# Patient Record
Sex: Female | Born: 1960 | Race: Black or African American | Hispanic: No | Marital: Single | Smoking: Never smoker
Health system: Southern US, Community
[De-identification: ages and names within clinical notes are randomized; demographics above are authoritative.]

## PROBLEM LIST (undated history)

## (undated) DIAGNOSIS — I10 Essential (primary) hypertension: Secondary | ICD-10-CM

## (undated) DIAGNOSIS — I219 Acute myocardial infarction, unspecified: Secondary | ICD-10-CM

## (undated) DIAGNOSIS — E119 Type 2 diabetes mellitus without complications: Secondary | ICD-10-CM

## (undated) DIAGNOSIS — F209 Schizophrenia, unspecified: Secondary | ICD-10-CM

## (undated) HISTORY — PX: OTHER SURGICAL HISTORY: SHX169

## (undated) HISTORY — PX: ABDOMINAL HYSTERECTOMY: SHX81

## (undated) HISTORY — PX: APPENDECTOMY: SHX54

---

## 2018-08-29 ENCOUNTER — Inpatient Hospital Stay (HOSPITAL_COMMUNITY)
Admission: EM | Admit: 2018-08-29 | Discharge: 2018-08-31 | DRG: 177 | Disposition: A | Discharge status: Home / Self Care | Payer: Medicaid - Out of State | Attending: Internal Medicine | Admitting: Internal Medicine

## 2018-08-29 ENCOUNTER — Emergency Department (HOSPITAL_COMMUNITY): Payer: Medicaid - Out of State

## 2018-08-29 ENCOUNTER — Encounter (HOSPITAL_COMMUNITY): Payer: Self-pay | Admitting: Internal Medicine

## 2018-08-29 ENCOUNTER — Other Ambulatory Visit: Payer: Self-pay

## 2018-08-29 DIAGNOSIS — Z880 Allergy status to penicillin: Secondary | ICD-10-CM

## 2018-08-29 DIAGNOSIS — Z90710 Acquired absence of both cervix and uterus: Secondary | ICD-10-CM | POA: Diagnosis not present

## 2018-08-29 DIAGNOSIS — J1282 Pneumonia due to coronavirus disease 2019: Secondary | ICD-10-CM

## 2018-08-29 DIAGNOSIS — J1289 Other viral pneumonia: Secondary | ICD-10-CM

## 2018-08-29 DIAGNOSIS — Z888 Allergy status to other drugs, medicaments and biological substances status: Secondary | ICD-10-CM | POA: Diagnosis not present

## 2018-08-29 DIAGNOSIS — R197 Diarrhea, unspecified: Secondary | ICD-10-CM | POA: Diagnosis present

## 2018-08-29 DIAGNOSIS — Z8249 Family history of ischemic heart disease and other diseases of the circulatory system: Secondary | ICD-10-CM | POA: Diagnosis not present

## 2018-08-29 DIAGNOSIS — E669 Obesity, unspecified: Secondary | ICD-10-CM | POA: Diagnosis present

## 2018-08-29 DIAGNOSIS — Z6835 Body mass index (BMI) 35.0-35.9, adult: Secondary | ICD-10-CM | POA: Diagnosis not present

## 2018-08-29 DIAGNOSIS — I2510 Atherosclerotic heart disease of native coronary artery without angina pectoris: Secondary | ICD-10-CM | POA: Diagnosis present

## 2018-08-29 DIAGNOSIS — Z833 Family history of diabetes mellitus: Secondary | ICD-10-CM | POA: Diagnosis not present

## 2018-08-29 DIAGNOSIS — J45909 Unspecified asthma, uncomplicated: Secondary | ICD-10-CM | POA: Diagnosis present

## 2018-08-29 DIAGNOSIS — U071 COVID-19: Principal | ICD-10-CM

## 2018-08-29 DIAGNOSIS — R739 Hyperglycemia, unspecified: Secondary | ICD-10-CM

## 2018-08-29 DIAGNOSIS — F209 Schizophrenia, unspecified: Secondary | ICD-10-CM | POA: Diagnosis present

## 2018-08-29 DIAGNOSIS — E876 Hypokalemia: Secondary | ICD-10-CM | POA: Diagnosis present

## 2018-08-29 LAB — COMPREHENSIVE METABOLIC PANEL
ALT: 24 U/L (ref 0–44)
AST: 39 U/L (ref 15–41)
Albumin: 3.4 g/dL — ABNORMAL LOW (ref 3.5–5.0)
Alkaline Phosphatase: 121 U/L (ref 38–126)
Anion gap: 13 (ref 5–15)
BUN: 9 mg/dL (ref 6–20)
CO2: 23 mmol/L (ref 22–32)
Calcium: 8.7 mg/dL — ABNORMAL LOW (ref 8.9–10.3)
Chloride: 97 mmol/L — ABNORMAL LOW (ref 98–111)
Creatinine, Ser: 0.71 mg/dL (ref 0.44–1.00)
GFR calc Af Amer: >60 mL/min (ref >60)
GFR calc non Af Amer: >60 mL/min (ref >60)
Glucose, Bld: 167 mg/dL — ABNORMAL HIGH (ref 70–99)
Potassium: 3.3 mmol/L — ABNORMAL LOW (ref 3.5–5.1)
Sodium: 133 mmol/L — ABNORMAL LOW (ref 135–145)
Total Bilirubin: 0.8 mg/dL (ref 0.3–1.2)
Total Protein: 7.4 g/dL (ref 6.5–8.1)

## 2018-08-29 LAB — CBC
HCT: 36.7 % (ref 36.0–46.0)
Hemoglobin: 12.3 g/dL (ref 12.0–15.0)
MCH: 26.7 pg (ref 26.0–34.0)
MCHC: 33.5 g/dL (ref 30.0–36.0)
MCV: 79.6 fL — ABNORMAL LOW (ref 80.0–100.0)
Platelets: 254 10*3/uL (ref 150–400)
RBC: 4.61 MIL/uL (ref 3.87–5.11)
RDW: 12.4 % (ref 11.5–15.5)
WBC: 6.1 10*3/uL (ref 4.0–10.5)
nRBC: 0 % (ref 0.0–0.2)

## 2018-08-29 LAB — PROCALCITONIN: Procalcitonin: <0.1 ng/mL

## 2018-08-29 LAB — SARS CORONAVIRUS 2 BY RT PCR (HOSPITAL ORDER, PERFORMED IN ~~LOC~~ HOSPITAL LAB): SARS Coronavirus 2: POSITIVE — AB

## 2018-08-29 LAB — CBC WITH DIFFERENTIAL/PLATELET
Abs Immature Granulocytes: 0 10*3/uL (ref 0.00–0.07)
Basophils Absolute: 0 10*3/uL (ref 0.0–0.1)
Basophils Relative: 0 %
Eosinophils Absolute: 0 10*3/uL (ref 0.0–0.5)
Eosinophils Relative: 0 %
HCT: 38.2 % (ref 36.0–46.0)
Hemoglobin: 12.8 g/dL (ref 12.0–15.0)
Lymphocytes Relative: 10 %
Lymphs Abs: 0.7 10*3/uL (ref 0.7–4.0)
MCH: 26.7 pg (ref 26.0–34.0)
MCHC: 33.5 g/dL (ref 30.0–36.0)
MCV: 79.6 fL — ABNORMAL LOW (ref 80.0–100.0)
Monocytes Absolute: 0.1 10*3/uL (ref 0.1–1.0)
Monocytes Relative: 1 %
Neutro Abs: 6.1 10*3/uL (ref 1.7–7.7)
Neutrophils Relative %: 89 %
Platelets: 270 10*3/uL (ref 150–400)
RBC: 4.8 MIL/uL (ref 3.87–5.11)
RDW: 12.4 % (ref 11.5–15.5)
WBC: 6.8 10*3/uL (ref 4.0–10.5)
nRBC: 0 % (ref 0.0–0.2)
nRBC: 0 /100 WBC

## 2018-08-29 LAB — LACTIC ACID, PLASMA: Lactic Acid, Venous: 0.9 mmol/L (ref 0.5–1.9)

## 2018-08-29 LAB — D-DIMER, QUANTITATIVE: D-Dimer, Quant: 0.67 ug/mL-FEU — ABNORMAL HIGH (ref 0.00–0.50)

## 2018-08-29 LAB — C-REACTIVE PROTEIN: CRP: 8.7 mg/dL — ABNORMAL HIGH (ref <1.0)

## 2018-08-29 LAB — FIBRINOGEN: Fibrinogen: 601 mg/dL — ABNORMAL HIGH (ref 210–475)

## 2018-08-29 LAB — FERRITIN: Ferritin: 108 ng/mL (ref 11–307)

## 2018-08-29 LAB — TRIGLYCERIDES: Triglycerides: 109 mg/dL (ref <150)

## 2018-08-29 LAB — LACTATE DEHYDROGENASE: LDH: 245 U/L — ABNORMAL HIGH (ref 98–192)

## 2018-08-29 MED ORDER — ACETAMINOPHEN 325 MG PO TABS
650.0000 mg | ORAL_TABLET | Freq: Once | ORAL | Status: AC
  Administered 2018-08-29: 650 mg via ORAL
  Filled 2018-08-29: qty 2

## 2018-08-29 MED ORDER — SODIUM CHLORIDE 0.9 % IV SOLN
Freq: Once | INTRAVENOUS | Status: AC
  Administered 2018-08-29: 19:00:00 via INTRAVENOUS

## 2018-08-29 MED ORDER — IBUPROFEN 400 MG PO TABS
600.0000 mg | ORAL_TABLET | Freq: Once | ORAL | Status: AC
  Administered 2018-08-29: 19:00:00 600 mg via ORAL
  Filled 2018-08-29: qty 1

## 2018-08-29 MED ORDER — ACETAMINOPHEN 325 MG PO TABS
650.0000 mg | ORAL_TABLET | Freq: Four times a day (QID) | ORAL | Status: DC | PRN
  Administered 2018-08-30: 650 mg via ORAL
  Filled 2018-08-29 (×2): qty 2

## 2018-08-29 MED ORDER — ACETAMINOPHEN 650 MG RE SUPP: 650.0000 mg | Freq: Four times a day (QID) | RECTAL | Status: DC | PRN

## 2018-08-29 MED ORDER — ONDANSETRON HCL 4 MG/2ML IJ SOLN: 4.0000 mg | Freq: Four times a day (QID) | INTRAMUSCULAR | Status: DC | PRN

## 2018-08-29 MED ORDER — POTASSIUM CHLORIDE CRYS ER 20 MEQ PO TBCR
20.0000 meq | EXTENDED_RELEASE_TABLET | Freq: Once | ORAL | Status: AC
  Administered 2018-08-30: 20 meq via ORAL
  Filled 2018-08-29: qty 1

## 2018-08-29 MED ORDER — ONDANSETRON HCL 4 MG PO TABS: 4.0000 mg | ORAL_TABLET | Freq: Four times a day (QID) | ORAL | Status: DC | PRN

## 2018-08-29 MED ORDER — ENOXAPARIN SODIUM 40 MG/0.4ML ~~LOC~~ SOLN
40.0000 mg | Freq: Every day | SUBCUTANEOUS | Status: DC
  Administered 2018-08-30: 10:00:00 40 mg via SUBCUTANEOUS
  Filled 2018-08-29 (×2): qty 0.4

## 2018-08-29 NOTE — ED Notes (Signed)
Attempted report.  Gave phone number and will return call.

## 2018-08-29 NOTE — ED Notes (Signed)
Dr. Linwood Dibbles at bedside.

## 2018-08-29 NOTE — ED Notes (Signed)
carelink called, next on list

## 2018-08-29 NOTE — ED Provider Notes (Signed)
[facility] EMERGENCY DEPARTMENT Provider Note   CSN: [id number] Arrival date & time: 08/29/18  1836    History   Chief Complaint Chief Complaint  Patient presents with  . Fever  . Chest Pain    HPI Lauren Cummings is a 58 y.o. female with PMH of CAD, asthma, schizophrenia presents with fever, productive cough, shortness of breath, N/V and body aches since Saturday. She denies any sick contacts or any known COVID exposure. She works at [employer]. She has been out of her psychiatric medications for about 45 days but is working to get into a psychiatric practice here. She denies SI/HI.      HPI  History reviewed. No pertinent past medical history.  There are no active problems to display for this patient.   Past Surgical History:  Procedure Laterality Date  . ABDOMINAL HYSTERECTOMY    . APPENDECTOMY    . hemoorhoid       OB History   No obstetric history on file.      Home Medications    Prior to Admission medications   Not on File    Family History Family History  Problem Relation Age of Onset  . Diabetes Mellitus II Mother   . Diabetes Mellitus II Father   . Diabetes Mellitus II Brother   . CAD Brother     Social History Social History   Tobacco Use  . Smoking status: Never Smoker  . Smokeless tobacco: Never Used  Substance Use Topics  . Alcohol use: Never    Frequency: Never  . Drug use: Not on file     Allergies   Keflex [cephalexin] and Penicillins   Review of Systems Review of Systems  Constitutional: Positive for chills and fever.  HENT: Positive for congestion.   Respiratory: Positive for cough and shortness of breath.   Cardiovascular: Negative for chest pain.  Gastrointestinal: Positive for nausea and vomiting.  Psychiatric/Behavioral: Negative for suicidal ideas.   Physical Exam Updated Vital Signs BP 127/76   Pulse 99   Temp 98.8 F (37.1 C) (Oral)   Resp 19   Ht 5\' 9"  (1.753 m)   Wt 108.9 kg   SpO2  95%   BMI 35.44 kg/m   Physical Exam Constitutional:      Appearance: She is obese. She is ill-appearing.  HENT:     Head: Normocephalic.  Cardiovascular:     Rate and Rhythm: Normal rate and regular rhythm.     Heart sounds: No murmur.  Pulmonary:     Effort: Tachypnea present.     Breath sounds: No decreased breath sounds, rhonchi or rales.     Comments: Hypoxia to 90 with movement. Abdominal:     Palpations: Abdomen is soft.     Tenderness: There is no abdominal tenderness.     Comments: Hypoactive BS  Skin:    General: Skin is warm and dry.  Neurological:     Mental Status: She is alert and oriented to person, place, and time.  Psychiatric:        Mood and Affect: Mood normal.        Behavior: Behavior normal.    ED Treatments / Results  Labs (all labs ordered are listed, but only abnormal results are displayed) Labs Reviewed  SARS CORONAVIRUS 2 (HOSPITAL ORDER, PERFORMED IN  HOSPITAL LAB) - Abnormal; Notable for the following components:      Result Value   SARS Coronavirus 2 POSITIVE (*)  All other components within normal limits  CBC WITH DIFFERENTIAL/PLATELET - Abnormal; Notable for the following components:   MCV 79.6 (*)    All other components within normal limits  COMPREHENSIVE METABOLIC PANEL - Abnormal; Notable for the following components:   Sodium 133 (*)    Potassium 3.3 (*)    Chloride 97 (*)    Glucose, Bld 167 (*)    Calcium 8.7 (*)    Albumin 3.4 (*)    All other components within normal limits  D-DIMER, QUANTITATIVE (NOT AT [facility]) - Abnormal; Notable for the following components:   D-Dimer, Quant 0.67 (*)    All other components within normal limits  LACTATE DEHYDROGENASE - Abnormal; Notable for the following components:   LDH 245 (*)    All other components within normal limits  FIBRINOGEN - Abnormal; Notable for the following components:   Fibrinogen 601 (*)    All other components within normal limits  C-REACTIVE PROTEIN -  Abnormal; Notable for the following components:   CRP 8.7 (*)    All other components within normal limits  CULTURE, BLOOD (ROUTINE X 2)  CULTURE, BLOOD (ROUTINE X 2)  LACTIC ACID, PLASMA  PROCALCITONIN  TRIGLYCERIDES  FERRITIN  LACTIC ACID, PLASMA  I-STAT BETA HCG BLOOD, ED ([facility], [facility], [facility] ONLY)    EKG EKG Interpretation  Date/Time:  Thursday August 29 2018 18:40:53 EDT Ventricular Rate:  97 PR Interval:    QRS Duration: 75 QT Interval:  353 QTC Calculation: 449 R Axis:   32 Text Interpretation:  Sinus rhythm Probable anteroseptal infarct, old Borderline repolarization abnormality no prior to compare with Confirmed by Meridee ScoreButler, Michael [phone]([id number]) on 08/29/2018 6:53:32 PM   Radiology Dg Chest Port 1 View  Result Date: 08/29/2018 CLINICAL DATA:  Shortness of breath EXAM: PORTABLE CHEST 1 VIEW COMPARISON:  None. FINDINGS: There are multifocal airspace opacities involving all lung fields. No pneumothorax. No large pleural effusion. The heart size is enlarged. There is no acute osseous abnormality. IMPRESSION: 1. Multifocal airspace opacities concerning for developing multifocal pneumonia (viral or bacterial) or pulmonary edema. 2. Cardiomegaly. Electronically Signed   By: Katherine Mantlehristopher  Green M.D.   On: 08/29/2018 19:34    Procedures Procedures (including critical care time)  Medications Ordered in ED Medications  0.9 %  sodium chloride infusion ( Intravenous New Bag/Given 08/29/18 1922)  ibuprofen (ADVIL) tablet 600 mg (600 mg Oral Given 08/29/18 1920)  acetaminophen (TYLENOL) tablet 650 mg (650 mg Oral Given 08/29/18 2140)     Initial Impression / Assessment and Plan / ED Course  I have reviewed the triage vital signs and the nursing notes.  Pertinent labs & imaging results that were available during my care of the patient were reviewed by me and considered in my medical decision making (see chart for details).  Clinical Course as of Aug 28 2141  Thu Aug 29, 2018  [id number] 58 year old female  with history of asthma complaining of high fevers nausea vomiting diarrhea cough and chest pain with cough its been going on since Sunday.  No known Covid exposures.  Here she is hypertensive tachypneic but satting okay but febrile to 103.  Lungs clear.  She is getting cultures and Covid testing along with basic labs.    [MB]  2103 Discussed with the hospitalist who will evaluate her for admission.   [MB]    Clinical Course User Index [MB] Terrilee FilesButler, Michael C, MD     CXR notable for multifocal pneumonia, D dimer and fibrinogen elevated. COVID  positive. Hospitalist to admit.  Final Clinical Impressions(s) / ED Diagnoses   Final diagnoses:  Pneumonia due to COVID-19 virus    ED Discharge Orders    None       Ellwood Dense, DO 08/29/18 2143    Terrilee Files, MD 08/30/18 1046

## 2018-08-29 NOTE — H&P (Signed)
History and Physical    Lauren Cummings DOB: 03-14-1961 DOA: 08/29/2018  PCP: Patient, No Pcp Per   Patient coming from: Home.  Chief Complaint: Shortness of breath.  HPI: Lauren Cummings is a 58 y.o. female with no significant past medical history has been experiencing fever nausea vomiting diarrhea and shortness of breath last 4 days.  Patient states she was off from her work last 1 week since son and daughter was visiting from [place].  Last 4 days she has been experiencing these symptoms and had gone to work today and since he was febrile patient came to the ER.  Patient is feeling weak and tired.  Denies any headache neck pain or any blood in the vomiting or diarrhea.  Denies any dysuria.  ED Course: In the ER patient was febrile with temperature of 103 F initial blood pressure was in the 90s.  Pulse was 99/min.  Chest x-ray shows pneumonia and lab work shows mild hypokalemia.  Patient's COVID-19 test came positive.  And has been admitted for further management.  Review of Systems: As per HPI, rest all negative.   History reviewed. No pertinent past medical history.  Past Surgical History:  Procedure Laterality Date  . ABDOMINAL HYSTERECTOMY    . APPENDECTOMY    . hemoorhoid       reports that she has never smoked. She has never used smokeless tobacco. She reports that she does not drink alcohol. No history on file for drug.  Allergies  Allergen Reactions  . Keflex [Cephalexin]   . Penicillins     Family History  Problem Relation Age of Onset  . Diabetes Mellitus II Mother   . Diabetes Mellitus II Father   . Diabetes Mellitus II Brother   . CAD Brother     Prior to Admission medications   Not on File    Physical Exam: Vitals:   08/29/18 2141 08/29/18 2145 08/29/18 2230 08/29/18 2325  BP:  118/72 105/72 117/65  Pulse:  82 70 79  Resp:  (!) 27  20  Temp: 98.8 F (37.1 C)     TempSrc: Oral     SpO2:  97% 92% 97%  Weight:      Height:           Constitutional: Moderately built and nourished. Vitals:   08/29/18 2141 08/29/18 2145 08/29/18 2230 08/29/18 2325  BP:  118/72 105/72 117/65  Pulse:  82 70 79  Resp:  (!) 27  20  Temp: 98.8 F (37.1 C)     TempSrc: Oral     SpO2:  97% 92% 97%  Weight:      Height:       Eyes: Anicteric no pallor. ENMT: No discharge from the ears eyes nose and mouth. Neck: No mass felt.  No neck rigidity. Respiratory: No rhonchi or crepitations. Cardiovascular: S1-S2 heard. Abdomen: Soft nontender bowel sounds present. Musculoskeletal: No edema.  No joint effusion. Skin: No rash. Neurologic: Alert awake oriented to time place and person.  Moves all extremities. Psychiatric: Appears normal per normal affect.   Labs on Admission: I have personally reviewed following labs and imaging studies  CBC: Recent Labs  Lab 08/29/18 1852  WBC 6.8  NEUTROABS 6.1  HGB 12.8  HCT 38.2  MCV 79.6*  PLT 270   Basic Metabolic Panel: Recent Labs  Lab 08/29/18 1852  NA 133*  K 3.3*  CL 97*  CO2 23  GLUCOSE 167*  BUN 9  CREATININE 0.71  CALCIUM 8.7*   GFR: Estimated Creatinine Clearance: 104.5 mL/min (by C-G formula based on SCr of 0.71 mg/dL). Liver Function Tests: Recent Labs  Lab 08/29/18 1852  AST 39  ALT 24  ALKPHOS 121  BILITOT 0.8  PROT 7.4  ALBUMIN 3.4*   No results for input(s): LIPASE, AMYLASE in the last 168 hours. No results for input(s): AMMONIA in the last 168 hours. Coagulation Profile: No results for input(s): INR, PROTIME in the last 168 hours. Cardiac Enzymes: No results for input(s): CKTOTAL, CKMB, CKMBINDEX, TROPONINI in the last 168 hours. BNP (last 3 results) No results for input(s): PROBNP in the last 8760 hours. HbA1C: No results for input(s): HGBA1C in the last 72 hours. CBG: No results for input(s): GLUCAP in the last 168 hours. Lipid Profile: Recent Labs    08/29/18 1852  TRIG 109   Thyroid Function Tests: No results for input(s): TSH,  T4TOTAL, FREET4, T3FREE, THYROIDAB in the last 72 hours. Anemia Panel: Recent Labs    08/29/18 1915  FERRITIN 108   Urine analysis: No results found for: COLORURINE, APPEARANCEUR, LABSPEC, PHURINE, GLUCOSEU, HGBUR, BILIRUBINUR, KETONESUR, PROTEINUR, UROBILINOGEN, NITRITE, LEUKOCYTESUR Sepsis Labs: @LABRCNTIP (procalcitonin:4,lacticidven:4) ) Recent Results (from the past 240 hour(s))  SARS Coronavirus 2 [facility] order, Performed in [facility] hospital lab)     Status: Abnormal   Collection Time: 08/29/18  7:10 PM  Result Value Ref Range Status   SARS Coronavirus 2 POSITIVE (A) NEGATIVE Final    Comment: RESULT CALLED TO, READ BACK BY AND VERIFIED WITH: Minus Breeding RN 08/29/18 2041 JDW (NOTE) If result is NEGATIVE SARS-CoV-2 target nucleic acids are NOT DETECTED. The SARS-CoV-2 RNA is generally detectable in upper and lower  respiratory specimens during the acute phase of infection. The lowest  concentration of SARS-CoV-2 viral copies this assay can detect is 250  copies / mL. A negative result does not preclude SARS-CoV-2 infection  and should not be used as the sole basis for treatment or other  patient management decisions.  A negative result may occur with  improper specimen collection / handling, submission of specimen other  than nasopharyngeal swab, presence of viral mutation(s) within the  areas targeted by this assay, and inadequate number of viral copies  (<250 copies / mL). A negative result must be combined with clinical  observations, patient history, and epidemiological information. If result is POSITIVE SARS-CoV-2 target nucleic acids are DETECTED. The SARS- CoV-2 RNA is generally detectable in upper and lower  respiratory specimens during the acute phase of infection.  Positive  results are indicative of active infection with SARS-CoV-2.  Clinical  correlation with patient history and other diagnostic information is  necessary to determine patient infection status.   Positive results do  not rule out bacterial infection or co-infection with other viruses. If result is PRESUMPTIVE POSTIVE SARS-CoV-2 nucleic acids MAY BE PRESENT.   A presumptive positive result was obtained on the submitted specimen  and confirmed on repeat testing.  While 2019 novel coronavirus  (SARS-CoV-2) nucleic acids may be present in the submitted sample  additional confirmatory testing may be necessary for epidemiological  and / or clinical management purposes  to differentiate between  SARS-CoV-2 and other Sarbecovirus currently known to infect humans.  If clinically indicated additional testing with an alternate test  methodology [phone]) is advised . The SARS-CoV-2 RNA is generally  detectable in upper and lower respiratory specimens during the acute  phase of infection. The expected result is Negative. Fact Sheet for  Patients:  BoilerBrush.com.cy Fact Sheet for Healthcare Providers: https://pope.com/ This test is not yet approved or cleared by the [place] FDA and has been authorized for detection and/or diagnosis of SARS-CoV-2 by FDA under an Emergency Use Authorization (EUA).  This EUA will remain in effect (meaning this test can be used) for the duration of the COVID-19 declaration under Section 564(b)(1) of the Act, 21 U.S.C. section 360bbb-3(b)(1), unless the authorization is terminated or revoked sooner. Performed at [facility], [address], [place], [place] [address]      Radiological Exams on Admission: Dg Chest Port 1 View  Result Date: 08/29/2018 CLINICAL DATA:  Shortness of breath EXAM: PORTABLE CHEST 1 VIEW COMPARISON:  None. FINDINGS: There are multifocal airspace opacities involving all lung fields. No pneumothorax. No large pleural effusion. The heart size is enlarged. There is no acute osseous abnormality. IMPRESSION: 1. Multifocal airspace opacities concerning for developing multifocal  pneumonia (viral or bacterial) or pulmonary edema. 2. Cardiomegaly. Electronically Signed   By: Katherine Mantle M.D.   On: 08/29/2018 19:34    EKG: Independently reviewed.  Normal sinus rhythm with inferolateral T wave changes.  Assessment/Plan Principal Problem:   Pneumonia due to COVID-19 virus Active Problems:   Hyperglycemia    1. COVID-19 pneumonia -we will admit to inpatient and follow CRP ferritin LDH.  Discussed with pharmacy about monitoring for use of Remdesivir. 2. Hyperglycemia -with family history of diabetes mellitus we will check hemoglobin A1c. 3. Hypokalemia likely from vomiting.  Replace recheck.  Check magnesium. 4. Nausea vomiting and diarrhea likely from COVID.  Abdomen appears benign.      DVT prophylaxis: Lovenox. Code Status: Full code. Family Communication: Discussed with patient. Disposition Plan: Home. Consults called: None. Admission status: Inpatient.   Eduard Clos MD [facility] Pager [phone].  If 7PM-7AM, please contact night-coverage [email or web address] Password [facility]  08/29/2018, 11:33 PM

## 2018-08-29 NOTE — ED Provider Notes (Signed)
[facility] EMERGENCY DEPARTMENT Provider Note   CSN: [id number] Arrival date & time: 08/29/18  1836    History   Chief Complaint Chief Complaint  Patient presents with   Fever   Chest Pain    HPI Lauren Cummings is a 58 y.o. female.  She has a history of significant asthma.  She is complaining of for 5 days of fever nausea vomiting diarrhea shortness of breath cough.  She works I believe at the [employer].  She was held out of work for a couple of days due to the fever but went back today and was found to be febrile again.  Her cough is been nonproductive and not associate with any hemoptysis.  She feels its worsening her asthma.      Fever  Temp source:  Oral Severity:  Moderate Onset quality:  Gradual Timing:  Intermittent Progression:  Unchanged Chronicity:  New Relieved by:  Nothing Worsened by:  Nothing Ineffective treatments:  Acetaminophen Associated symptoms: chest pain, chills, cough, diarrhea, headaches, myalgias, nausea, sore throat and vomiting   Associated symptoms: no dysuria and no rash   Risk factors: no immunosuppression and no recent travel   Chest Pain  Associated symptoms: cough, fever, headache, nausea and vomiting   Associated symptoms: no abdominal pain and no shortness of breath     History reviewed. No pertinent past medical history.  Patient Active Problem List   Diagnosis Date Noted   Pneumonia due to COVID-19 virus 08/29/2018    Past Surgical History:  Procedure Laterality Date   ABDOMINAL HYSTERECTOMY     APPENDECTOMY     hemoorhoid       OB History   No obstetric history on file.      Home Medications    Prior to Admission medications   Not on File    Family History Family History  Problem Relation Age of Onset   Diabetes Mellitus II Mother    Diabetes Mellitus II Father    Diabetes Mellitus II Brother    CAD Brother     Social History Social History   Tobacco Use   Smoking status:  Never Smoker   Smokeless tobacco: Never Used  Substance Use Topics   Alcohol use: Never    Frequency: Never   Drug use: Not on file     Allergies   Keflex [cephalexin] and Penicillins   Review of Systems Review of Systems  Constitutional: Positive for chills and fever.  HENT: Positive for sore throat.   Eyes: Negative for visual disturbance.  Respiratory: Positive for cough. Negative for shortness of breath.   Cardiovascular: Positive for chest pain.  Gastrointestinal: Positive for diarrhea, nausea and vomiting. Negative for abdominal pain.  Genitourinary: Negative for dysuria.  Musculoskeletal: Positive for myalgias.  Skin: Negative for rash.  Neurological: Positive for headaches.     Physical Exam Updated Vital Signs BP 105/72    Pulse 70    Temp 98.8 F (37.1 C) (Oral)    Resp (!) 27    Ht  (1.753 m)    Wt 108.9 kg    SpO2 92%    BMI 35.44 kg/m   Physical Exam Vitals signs and nursing note reviewed.  Constitutional:      General: She is not in acute distress.    Appearance: She is well-developed. She is ill-appearing.  HENT:     Head: Normocephalic and atraumatic.  Eyes:     Conjunctiva/sclera: Conjunctivae normal.  Neck:  Musculoskeletal: Neck supple.  Cardiovascular:     Rate and Rhythm: Normal rate and regular rhythm.     Heart sounds: Normal heart sounds. No murmur.  Pulmonary:     Effort: Pulmonary effort is normal. No respiratory distress.     Breath sounds: Normal breath sounds.  Abdominal:     Palpations: Abdomen is soft. There is no mass.     Tenderness: There is no abdominal tenderness.  Musculoskeletal:     Right lower leg: She exhibits no tenderness.     Left lower leg: She exhibits no tenderness.  Skin:    General: Skin is warm and dry.     Capillary Refill: Capillary refill takes less than 2 seconds.  Neurological:     General: No focal deficit present.     Mental Status: She is alert and oriented to person, place, and time.       ED Treatments / Results  Labs (all labs ordered are listed, but only abnormal results are displayed) Labs Reviewed  SARS CORONAVIRUS 2 (HOSPITAL ORDER, PERFORMED IN [facility] LAB) - Abnormal; Notable for the following components:      Result Value   SARS Coronavirus 2 POSITIVE (*)    All other components within normal limits  CBC WITH DIFFERENTIAL/PLATELET - Abnormal; Notable for the following components:   MCV 79.6 (*)    All other components within normal limits  COMPREHENSIVE METABOLIC PANEL - Abnormal; Notable for the following components:   Sodium 133 (*)    Potassium 3.3 (*)    Chloride 97 (*)    Glucose, Bld 167 (*)    Calcium 8.7 (*)    Albumin 3.4 (*)    All other components within normal limits  D-DIMER, QUANTITATIVE (NOT AT [facility]) - Abnormal; Notable for the following components:   D-Dimer, Quant 0.67 (*)    All other components within normal limits  LACTATE DEHYDROGENASE - Abnormal; Notable for the following components:   LDH 245 (*)    All other components within normal limits  FIBRINOGEN - Abnormal; Notable for the following components:   Fibrinogen 601 (*)    All other components within normal limits  C-REACTIVE PROTEIN - Abnormal; Notable for the following components:   CRP 8.7 (*)    All other components within normal limits  CULTURE, BLOOD (ROUTINE X 2)  CULTURE, BLOOD (ROUTINE X 2)  LACTIC ACID, PLASMA  PROCALCITONIN  TRIGLYCERIDES  FERRITIN  LACTIC ACID, PLASMA    EKG EKG Interpretation  Date/Time:  Thursday August 29 2018 18:40:53 EDT Ventricular Rate:  97 PR Interval:    QRS Duration: 75 QT Interval:  353 QTC Calculation: 449 R Axis:   32 Text Interpretation:  Sinus rhythm Probable anteroseptal infarct, old Borderline repolarization abnormality no prior to compare with Confirmed by Meridee ScoreButler, Breylan Lefevers [phone]([id number]) on 08/29/2018 6:53:32 PM   Radiology Dg Chest Port 1 View  Result Date: 08/29/2018 CLINICAL DATA:  Shortness of breath  EXAM: PORTABLE CHEST 1 VIEW COMPARISON:  None. FINDINGS: There are multifocal airspace opacities involving all lung fields. No pneumothorax. No large pleural effusion. The heart size is enlarged. There is no acute osseous abnormality. IMPRESSION: 1. Multifocal airspace opacities concerning for developing multifocal pneumonia (viral or bacterial) or pulmonary edema. 2. Cardiomegaly. Electronically Signed   By: Katherine Mantlehristopher  Green M.D.   On: 08/29/2018 19:34    Procedures Procedures (including critical care time)  Medications Ordered in ED Medications  potassium chloride SA (K-DUR) CR tablet 20 mEq (has no  administration in time range)  0.9 %  sodium chloride infusion ( Intravenous New Bag/Given 08/29/18 1922)  ibuprofen (ADVIL) tablet 600 mg (600 mg Oral Given 08/29/18 1920)  acetaminophen (TYLENOL) tablet 650 mg (650 mg Oral Given 08/29/18 2140)     Initial Impression / Assessment and Plan / ED Course  I have reviewed the triage vital signs and the nursing notes.  Pertinent labs & imaging results that were available during my care of the patient were reviewed by me and considered in my medical decision making (see chart for details).  Clinical Course as of Aug 29 2319  Thu Aug 29, 2018  [id number] 58 year old female with history of asthma complaining of high fevers nausea vomiting diarrhea cough and chest pain with cough its been going on since Sunday.  No known Covid exposures.  Here she is hypertensive tachypneic but satting okay but febrile to 103.  Lungs clear.  She is getting cultures and Covid testing along with basic labs.    [MB]  2103 Discussed with the hospitalist who will evaluate her for admission.   [MB]    Clinical Course User Index [MB] Terrilee Files, MD        Final Clinical Impressions(s) / ED Diagnoses   Final diagnoses:  Pneumonia due to COVID-19 virus    ED Discharge Orders    None       Terrilee Files, MD 08/30/18 1024

## 2018-08-29 NOTE — ED Notes (Signed)
ED TO INPATIENT HANDOFF REPORT  ED Nurse Name and Phone #: [phone]  S Name/Age/Gender Lauren Cummings 58 y.o. female Room/Bed: TRACC/TRACC  Code Status   Code Status: Not on file  Home/SNF/Other Home AO x 4   Triage Complete: Triage complete  Chief Complaint Cough, Fever  Triage Note Came in via ems; reported been feeling sick since Saturday with fever, cough, congestion along w/ nausea and diarrhea.    Allergies Allergies  Allergen Reactions  . Keflex [Cephalexin]   . Penicillins     Level of Care/Admitting Diagnosis ED Disposition    ED Disposition Condition Comment   Admit  The patient appears reasonably stabilized for admission considering the current resources, flow, and capabilities available in the ED at this time, and I doubt any other [facility] requiring further screening and/or treatment in the ED prior to admission is  present.       B Medical/Surgery History History reviewed. No pertinent past medical history. Past Surgical History:  Procedure Laterality Date  . ABDOMINAL HYSTERECTOMY    . APPENDECTOMY    . hemoorhoid       A IV Location/Drains/Wounds Patient Lines/Drains/Airways Status   Active Line/Drains/Airways    Name:   Placement date:   Placement time:   Site:   Days:   Peripheral IV 08/29/18 Left Antecubital   08/29/18    1850    Antecubital   less than 1          Intake/Output Last 24 hours No intake or output data in the 24 hours ending 08/29/18 2245  Labs/Imaging Results for orders placed or performed during the hospital encounter of 08/29/18 (from the past 48 hour(s))  Lactic acid, plasma     Status: None   Collection Time: 08/29/18  6:52 PM  Result Value Ref Range   Lactic Acid, Venous 0.9 0.5 - 1.9 mmol/L    Comment: Performed at [facility], [address] [place], [place] [address]  CBC WITH DIFFERENTIAL     Status: Abnormal   Collection Time: 08/29/18  6:52 PM  Result Value Ref Range   WBC 6.8 4.0 - 10.5 K/uL    RBC 4.80 3.87 - 5.11 MIL/uL   Hemoglobin 12.8 12.0 - 15.0 g/dL   HCT 81.1 91.4 - 78.2 %   MCV 79.6 (L) 80.0 - 100.0 fL   MCH 26.7 26.0 - 34.0 pg   MCHC 33.5 30.0 - 36.0 g/dL   RDW 95.6 21.3 - 08.6 %   Platelets 270 150 - 400 K/uL   nRBC 0.0 0.0 - 0.2 %   Neutrophils Relative % 89 %   Neutro Abs 6.1 1.7 - 7.7 K/uL   Lymphocytes Relative 10 %   Lymphs Abs 0.7 0.7 - 4.0 K/uL   Monocytes Relative 1 %   Monocytes Absolute 0.1 0.1 - 1.0 K/uL   Eosinophils Relative 0 %   Eosinophils Absolute 0.0 0.0 - 0.5 K/uL   Basophils Relative 0 %   Basophils Absolute 0.0 0.0 - 0.1 K/uL   nRBC 0 0 /100 WBC   Abs Immature Granulocytes 0.00 0.00 - 0.07 K/uL    Comment: Performed at [facility], [address] [place], [place] [address]  Comprehensive metabolic panel     Status: Abnormal   Collection Time: 08/29/18  6:52 PM  Result Value Ref Range   Sodium 133 (L) 135 - 145 mmol/L   Potassium 3.3 (L) 3.5 - 5.1 mmol/L   Chloride 97 (L)  98 - 111 mmol/L   CO2 23 22 - 32 mmol/L   Glucose, Bld 167 (H) 70 - 99 mg/dL   BUN 9 6 - 20 mg/dL   Creatinine, Ser 1.610.71 0.44 - 1.00 mg/dL   Calcium 8.7 (L) 8.9 - 10.3 mg/dL   Total Protein 7.4 6.5 - 8.1 g/dL   Albumin 3.4 (L) 3.5 - 5.0 g/dL   AST 39 15 - 41 U/L   ALT 24 0 - 44 U/L   Alkaline Phosphatase 121 38 - 126 U/L   Total Bilirubin 0.8 0.3 - 1.2 mg/dL   GFR calc non Af Amer >60 >60 mL/min   GFR calc Af Amer >60 >60 mL/min   Anion gap 13 5 - 15    Comment: Performed at [facility], [address] [place], [place] [address]  D-dimer, quantitative     Status: Abnormal   Collection Time: 08/29/18  6:52 PM  Result Value Ref Range   D-Dimer, Quant 0.67 (H) 0.00 - 0.50 ug/mL-FEU    Comment: (NOTE) At the manufacturer cut-off of 0.50 ug/mL FEU, this assay has been documented to exclude PE with a sensitivity and negative predictive value of 97 to 99%.  At this time, this assay has not been approved by the FDA to exclude DVT/VTE. Results should  be correlated with clinical presentation. Performed at [facility], [address] [place], [place] [address]   Procalcitonin     Status: None   Collection Time: 08/29/18  6:52 PM  Result Value Ref Range   Procalcitonin <0.10 ng/mL    Comment:        Interpretation: PCT (Procalcitonin) <= 0.5 ng/mL: Systemic infection (sepsis) is not likely. Local bacterial infection is possible. (NOTE)       Sepsis PCT Algorithm           Lower Respiratory Tract                                      Infection PCT Algorithm    ----------------------------     ----------------------------         PCT < 0.25 ng/mL                PCT < 0.10 ng/mL         Strongly encourage             Strongly discourage   discontinuation of antibiotics    initiation of antibiotics    ----------------------------     -----------------------------       PCT 0.25 - 0.50 ng/mL            PCT 0.10 - 0.25 ng/mL               OR       >80% decrease in PCT            Discourage initiation of                                            antibiotics      Encourage discontinuation           of antibiotics    ----------------------------     -----------------------------         PCT >= 0.50 ng/mL  PCT 0.26 - 0.50 ng/mL               AND        <80% decrease in PCT             Encourage initiation of                                             antibiotics       Encourage continuation           of antibiotics    ----------------------------     -----------------------------        PCT >= 0.50 ng/mL                  PCT > 0.50 ng/mL               AND         increase in PCT                  Strongly encourage                                      initiation of antibiotics    Strongly encourage escalation           of antibiotics                                     -----------------------------                                           PCT <= 0.25 ng/mL                                                 OR                                         > 80% decrease in PCT                                     Discontinue / Do not initiate                                             antibiotics Performed at [facility], [address] [place], [place] [address]   Lactate dehydrogenase     Status: Abnormal   Collection Time: 08/29/18  6:52 PM  Result Value Ref Range   LDH 245 (H) 98 - 192 U/L    Comment: Performed at [facility], [address], [place], [place] [address]  Triglycerides     Status: None   Collection Time: 08/29/18  6:52 PM  Result Value Ref  Range   Triglycerides 109 <150 mg/dL    Comment: Performed at [facility], [address], [place], [place] [address]  Fibrinogen     Status: Abnormal   Collection Time: 08/29/18  6:52 PM  Result Value Ref Range   Fibrinogen 601 (H) 210 - 475 mg/dL    Comment: Performed at [facility], [address], [place], [place] [address]  SARS Coronavirus 2 [facility] order, Performed in [facility] hospital lab)     Status: Abnormal   Collection Time: 08/29/18  7:10 PM  Result Value Ref Range   SARS Coronavirus 2 POSITIVE (A) NEGATIVE    Comment: RESULT CALLED TO, READ BACK BY AND VERIFIED WITH: Minus Breeding RN 08/29/18 2041 JDW (NOTE) If result is NEGATIVE SARS-CoV-2 target nucleic acids are NOT DETECTED. The SARS-CoV-2 RNA is generally detectable in upper and lower  respiratory specimens during the acute phase of infection. The lowest  concentration of SARS-CoV-2 viral copies this assay can detect is 250  copies / mL. A negative result does not preclude SARS-CoV-2 infection  and should not be used as the sole basis for treatment or other  patient management decisions.  A negative result may occur with  improper specimen collection / handling, submission of specimen other  than nasopharyngeal swab, presence of viral mutation(s) within the  areas targeted by this assay, and inadequate number of viral copies  (<250 copies / mL).  A negative result must be combined with clinical  observations, patient history, and epidemiological information. If result is POSITIVE SARS-CoV-2 target nucleic acids are DETECTED. The SARS- CoV-2 RNA is generally detectable in upper and lower  respiratory specimens during the acute phase of infection.  Positive  results are indicative of active infection with SARS-CoV-2.  Clinical  correlation with patient history and other diagnostic information is  necessary to determine patient infection status.  Positive results do  not rule out bacterial infection or co-infection with other viruses. If result is PRESUMPTIVE POSTIVE SARS-CoV-2 nucleic acids MAY BE PRESENT.   A presumptive positive result was obtained on the submitted specimen  and confirmed on repeat testing.  While 2019 novel coronavirus  (SARS-CoV-2) nucleic acids may be present in the submitted sample  additional confirmatory testing may be necessary for epidemiological  and / or clinical management purposes  to differentiate between  SARS-CoV-2 and other Sarbecovirus currently known to infect humans.  If clinically indicated additional testing with an alternate test  methodology [phone]) is advised . The SARS-CoV-2 RNA is generally  detectable in upper and lower respiratory specimens during the acute  phase of infection. The expected result is Negative. Fact Sheet for Patients:  BoilerBrush.com.cy Fact Sheet for Healthcare Providers: https://pope.com/ This test is not yet approved or cleared by the [place] FDA and has been authorized for detection and/or diagnosis of SARS-CoV-2 by FDA under an Emergency Use Authorization (EUA).  This EUA will remain in effect (meaning this test can be used) for the duration of the COVID-19 declaration under Section 564(b)(1) of the Act, 21 U.S.C. section 360bbb-3(b)(1), unless the authorization is terminated or revoked  sooner. Performed at [facility], [address], [place], [place] [address]   C-reactive protein     Status: Abnormal   Collection Time: 08/29/18  7:15 PM  Result Value Ref Range   CRP 8.7 (H) <1.0 mg/dL    Comment: Performed at [facility], [address], [place], [place] [address]  Ferritin  Status: None   Collection Time: 08/29/18  7:15 PM  Result Value Ref Range   Ferritin 108 11 - 307 ng/mL    Comment: Performed at [facility], [address] [place], [place] [address]   Dg Chest Port 1 View  Result Date: 08/29/2018 CLINICAL DATA:  Shortness of breath EXAM: PORTABLE CHEST 1 VIEW COMPARISON:  None. FINDINGS: There are multifocal airspace opacities involving all lung fields. No pneumothorax. No large pleural effusion. The heart size is enlarged. There is no acute osseous abnormality. IMPRESSION: 1. Multifocal airspace opacities concerning for developing multifocal pneumonia (viral or bacterial) or pulmonary edema. 2. Cardiomegaly. Electronically Signed   By: Katherine Mantle M.D.   On: 08/29/2018 19:34    Pending Labs Unresulted Labs (From admission, onward)    Start     Ordered   08/29/18 1852  Lactic acid, plasma  Now then every 2 hours,   STAT     08/29/18 1851   08/29/18 1852  Blood Culture (routine x 2)  BLOOD CULTURE X 2,   STAT    Question:  Patient immune status  Answer:  Normal   08/29/18 1851          Vitals/Pain Today's Vitals   08/29/18 2000 08/29/18 2030 08/29/18 2141 08/29/18 2145  BP: 106/77 98/66  118/72  Pulse: 93 86  82  Resp:    (!) 27  Temp:   98.8 F (37.1 C)   TempSrc:   Oral   SpO2: 91% 96%  97%  Weight:      Height:      PainSc:        Isolation Precautions No active isolations  Medications Medications  potassium chloride SA (K-DUR) CR tablet 20 mEq (has no administration in time range)  0.9 %  sodium chloride infusion ( Intravenous New Bag/Given 08/29/18 1922)  ibuprofen (ADVIL) tablet 600 mg (600 mg Oral  Given 08/29/18 1920)  acetaminophen (TYLENOL) tablet 650 mg (650 mg Oral Given 08/29/18 2140)    Mobility Self care Low fall risk  Focused Assessments PT AO x 4, dry cuff present in and on fever resolved with medication, SR on the monitor, 97-98% on RA. No skin problems, positive for COVID-19.   R Recommendations: See Admitting Provider Note  Report given to:   Additional Notes:

## 2018-08-29 NOTE — ED Triage Notes (Signed)
Came in via ems; reported been feeling sick since Saturday with fever, cough, congestion along w/ nausea and diarrhea.

## 2018-08-30 ENCOUNTER — Encounter (HOSPITAL_COMMUNITY): Payer: Self-pay

## 2018-08-30 LAB — LACTATE DEHYDROGENASE: LDH: 231 U/L — ABNORMAL HIGH (ref 98–192)

## 2018-08-30 LAB — CREATININE, SERUM
Creatinine, Ser: 0.86 mg/dL (ref 0.44–1.00)
GFR calc Af Amer: >60 mL/min (ref >60)
GFR calc non Af Amer: >60 mL/min (ref >60)

## 2018-08-30 LAB — D-DIMER, QUANTITATIVE: D-Dimer, Quant: 0.66 ug/mL-FEU — ABNORMAL HIGH (ref 0.00–0.50)

## 2018-08-30 LAB — MAGNESIUM: Magnesium: 1.7 mg/dL (ref 1.7–2.4)

## 2018-08-30 LAB — C-REACTIVE PROTEIN: CRP: 9.9 mg/dL — ABNORMAL HIGH (ref <1.0)

## 2018-08-30 LAB — TROPONIN I: Troponin I: <0.03 ng/mL (ref <0.03)

## 2018-08-30 MED ORDER — DIAZEPAM 5 MG PO TABS
5.0000 mg | ORAL_TABLET | Freq: Once | ORAL | Status: AC
  Administered 2018-08-30: 5 mg via ORAL

## 2018-08-30 MED ORDER — SODIUM CHLORIDE 0.9 % IV BOLUS
500.0000 mL | Freq: Once | INTRAVENOUS | Status: AC
  Administered 2018-08-30: 500 mL via INTRAVENOUS

## 2018-08-30 MED ORDER — DIAZEPAM 5 MG PO TABS
5.0000 mg | ORAL_TABLET | Freq: Two times a day (BID) | ORAL | Status: DC | PRN
  Administered 2018-08-30 – 2018-08-31 (×2): 5 mg via ORAL
  Filled 2018-08-30 (×2): qty 1

## 2018-08-30 MED ORDER — LOPERAMIDE HCL 2 MG PO CAPS
2.0000 mg | ORAL_CAPSULE | ORAL | Status: DC | PRN
  Administered 2018-08-30: 2 mg via ORAL
  Filled 2018-08-30: qty 1

## 2018-08-30 MED ORDER — METHYLPREDNISOLONE SODIUM SUCC 40 MG IJ SOLR
40.0000 mg | Freq: Three times a day (TID) | INTRAMUSCULAR | Status: DC
  Administered 2018-08-30: 40 mg via INTRAVENOUS
  Filled 2018-08-30: qty 1

## 2018-08-30 NOTE — ED Notes (Addendum)
Patient transferred to [facility] via [place]

## 2018-08-30 NOTE — Progress Notes (Signed)
[facility] PROGRESS NOTE    Progress Note  Lauren RoLydia N Perrow  NWG:[id number] DOB: February 23, 1961 DOA: 08/29/2018 PCP: Patient, No Pcp Per     Brief Narrative:   Lauren Cummings is an 58 y.o. female no past medical history is been experiencing nausea vomiting diarrhea and shortness of breath that started 4 days prior to admission.  She relates her son and daughter were visiting from [place] about a week prior to admission.  Started having fevers fatigue and muscle aches.  Assessment/Plan:   Nausea, vomiting and diarrhea due toPneumonia due to COVID-19 virus: Patient is not hypoxic we will continue Tylenol for fevers. Personally reviewed her chest x-ray and appears to have multifocal infiltrates. We will start her on Imodium for her diarrhea, Zofran for her nausea. Discontinue Solu-Medrol COVID-19 Labs  Recent Labs    08/29/18 1852 08/29/18 1915  DDIMER 0.67*  --   FERRITIN  --  108  LDH 245*  --   CRP  --  8.7*    Lab Results  Component Value Date   SARSCOV2NAA POSITIVE (A) 08/29/2018    Hyperglycemia: Continue sliding scale insulin. Discontinue IV steroids  DVT prophylaxis: lovenox Family Communication:none Disposition Plan/Barrier to D/C: unable to determine Code Status:     Code Status Orders  (From admission, onward)         Start     Ordered   08/29/18 2325  Full code  Continuous     08/29/18 2324        Code Status History    This patient has a current code status but no historical code status.        IV Access:    Peripheral IV   Procedures and diagnostic studies:   Dg Chest Port 1 View  Result Date: 08/29/2018 CLINICAL DATA:  Shortness of breath EXAM: PORTABLE CHEST 1 VIEW COMPARISON:  None. FINDINGS: There are multifocal airspace opacities involving all lung fields. No pneumothorax. No large pleural effusion. The heart size is enlarged. There is no acute osseous abnormality. IMPRESSION: 1. Multifocal airspace opacities concerning for  developing multifocal pneumonia (viral or bacterial) or pulmonary edema. 2. Cardiomegaly. Electronically Signed   By: Katherine Mantlehristopher  Green M.D.   On: 08/29/2018 19:34     Medical Consultants:    None.  Anti-Infectives:   None  Subjective:    Lauren RoLydia N Cummings she continues to have severe diarrhea and extreme nausea with some vomiting overnight.  Objective:    Vitals:   08/30/18 0124 08/30/18 0200 08/30/18 0232 08/30/18 0501  BP:   118/83 (!) 116/100  Pulse:   75 80  Resp:   18 (!) 26  Temp: 98.4 F (36.9 C)  98.2 F (36.8 C) (!) 100.7 F (38.2 C)  TempSrc: Oral  Oral Oral  SpO2:   95% 95%  Weight:      Height:  5\' 9"  (1.753 m)     No intake or output data in the 24 hours ending 08/30/18 0706 Filed Weights   08/29/18 1841  Weight: 108.9 kg    Exam: General exam: In no acute distress. Respiratory system: Good air movement and clear to auscultation. Cardiovascular system: S1 & S2 heard, RRR.  Gastrointestinal system: Abdomen is nondistended, soft and nontender.  Central nervous system: Alert and oriented. No focal neurological deficits. Extremities: No pedal edema. Skin: No rashes, lesions or ulcers Psychiatry: Judgement and insight appear normal. Mood & affect appropriate.     Data Reviewed:    Labs: Basic  Metabolic Panel: Recent Labs  Lab 08/29/18 1852 08/29/18 2343  NA 133*  --   K 3.3*  --   CL 97*  --   CO2 23  --   GLUCOSE 167*  --   BUN 9  --   CREATININE 0.71 0.86  CALCIUM 8.7*  --    GFR Estimated Creatinine Clearance: 97.2 mL/min (by C-G formula based on SCr of 0.86 mg/dL). Liver Function Tests: Recent Labs  Lab 08/29/18 1852  AST 39  ALT 24  ALKPHOS 121  BILITOT 0.8  PROT 7.4  ALBUMIN 3.4*   No results for input(s): LIPASE, AMYLASE in the last 168 hours. No results for input(s): AMMONIA in the last 168 hours. Coagulation profile No results for input(s): INR, PROTIME in the last 168 hours. COVID-19 Labs  Recent Labs     08/29/18 1852 08/29/18 1915  DDIMER 0.67*  --   FERRITIN  --  108  LDH 245*  --   CRP  --  8.7*    Lab Results  Component Value Date   SARSCOV2NAA POSITIVE (A) 08/29/2018    CBC: Recent Labs  Lab 08/29/18 1852 08/29/18 2343  WBC 6.8 6.1  NEUTROABS 6.1  --   HGB 12.8 12.3  HCT 38.2 36.7  MCV 79.6* 79.6*  PLT 270 254   Cardiac Enzymes: No results for input(s): CKTOTAL, CKMB, CKMBINDEX, TROPONINI in the last 168 hours. BNP (last 3 results) No results for input(s): PROBNP in the last 8760 hours. CBG: No results for input(s): GLUCAP in the last 168 hours. D-Dimer: Recent Labs    08/29/18 1852  DDIMER 0.67*   Hgb A1c: No results for input(s): HGBA1C in the last 72 hours. Lipid Profile: Recent Labs    08/29/18 1852  TRIG 109   Thyroid function studies: No results for input(s): TSH, T4TOTAL, T3FREE, THYROIDAB in the last 72 hours.  Invalid input(s): FREET3 Anemia work up: Recent Labs    08/29/18 1915  FERRITIN 108   Sepsis Labs: Recent Labs  Lab 08/29/18 1852 08/29/18 2343  PROCALCITON <0.10  --   WBC 6.8 6.1  LATICACIDVEN 0.9  --    Microbiology Recent Results (from the past 240 hour(s))  SARS Coronavirus 2 [facility] order, Performed in [facility] hospital lab)     Status: Abnormal   Collection Time: 08/29/18  7:10 PM  Result Value Ref Range Status   SARS Coronavirus 2 POSITIVE (A) NEGATIVE Final    Comment: RESULT CALLED TO, READ BACK BY AND VERIFIED WITH: Minus Breeding RN 08/29/18 2041 JDW (NOTE) If result is NEGATIVE SARS-CoV-2 target nucleic acids are NOT DETECTED. The SARS-CoV-2 RNA is generally detectable in upper and lower  respiratory specimens during the acute phase of infection. The lowest  concentration of SARS-CoV-2 viral copies this assay can detect is 250  copies / mL. A negative result does not preclude SARS-CoV-2 infection  and should not be used as the sole basis for treatment or other  patient management decisions.  A negative  result may occur with  improper specimen collection / handling, submission of specimen other  than nasopharyngeal swab, presence of viral mutation(s) within the  areas targeted by this assay, and inadequate number of viral copies  (<250 copies / mL). A negative result must be combined with clinical  observations, patient history, and epidemiological information. If result is POSITIVE SARS-CoV-2 target nucleic acids are DETECTED. The SARS- CoV-2 RNA is generally detectable in upper and lower  respiratory specimens during the acute  phase of infection.  Positive  results are indicative of active infection with SARS-CoV-2.  Clinical  correlation with patient history and other diagnostic information is  necessary to determine patient infection status.  Positive results do  not rule out bacterial infection or co-infection with other viruses. If result is PRESUMPTIVE POSTIVE SARS-CoV-2 nucleic acids MAY BE PRESENT.   A presumptive positive result was obtained on the submitted specimen  and confirmed on repeat testing.  While 2019 novel coronavirus  (SARS-CoV-2) nucleic acids may be present in the submitted sample  additional confirmatory testing may be necessary for epidemiological  and / or clinical management purposes  to differentiate between  SARS-CoV-2 and other Sarbecovirus currently known to infect humans.  If clinically indicated additional testing with an alternate test  methodology [phone]) is advised . The SARS-CoV-2 RNA is generally  detectable in upper and lower respiratory specimens during the acute  phase of infection. The expected result is Negative. Fact Sheet for Patients:  BoilerBrush.com.cy Fact Sheet for Healthcare Providers: https://pope.com/ This test is not yet approved or cleared by the [place] FDA and has been authorized for detection and/or diagnosis of SARS-CoV-2 by FDA under an Emergency Use Authorization  (EUA).  This EUA will remain in effect (meaning this test can be used) for the duration of the COVID-19 declaration under Section 564(b)(1) of the Act, 21 U.S.C. section 360bbb-3(b)(1), unless the authorization is terminated or revoked sooner. Performed at [facility], [address], [place], [place] [address]      Medications:   . enoxaparin (LOVENOX) injection  40 mg Subcutaneous Daily  . methylPREDNISolone (SOLU-MEDROL) injection  40 mg Intravenous Q8H   Continuous Infusions:    LOS: 1 day   Marinda Elk  [facility]  08/30/2018, 7:06 AM

## 2018-08-30 NOTE — Progress Notes (Signed)
Upon walking into patients room, patient seemed very restless, anxious, and fidgety.  Patient stated she needs something to help relax her.  She stated I have Schizophrenia.  I asked patient are you hearing voices and seeing things that are not there.  Patient stated yes, right now the voices are not as loud, but they are there.  I asked patient what medication she takes.  Patient didn't know all of her medications, but stated her Pharmacy is [facility] in [place].  They have a list of medications she takes. Patient stated she use to live in [place] and recently moved in town.  Patient stated her Psychiatrist was in the middle of transferring her medications and care to another office in town.  However her Medicaid was put on hold and due to Covid it was taking longer to fix it. Patient has not had her Psych Medications in 45 days.    Spoke to MD on call and patient was given a one time dose of Valium 5mg  tablet.

## 2018-08-30 NOTE — Progress Notes (Signed)
SATURATION QUALIFICATIONS: (This note is used to comply with regulatory documentation for home oxygen)  Patient Saturations on Room Air at Rest = 94%  Patient Saturations on Room Air while Ambulating = 100%  Patient Saturations on -- Liters of oxygen while Ambulating = --%  Please briefly explain why patient needs home oxygen:  No indication for home O2.    Veda Canning, PT

## 2018-08-30 NOTE — Evaluation (Signed)
Physical Therapy Evaluation and Discharge Patient Details Name: HYUN RANDT MRN: [id number] DOB: [birth year] Today's Date: 08/30/2018   History of Present Illness  NYILAH DASILVA is an 58 y.o. female no past medical history is been experiencing nausea vomiting diarrhea and shortness of breath that started 4 days prior to admission.  She relates her son and daughter were visiting from [place] about a week prior to admission.  Started having fevers fatigue and muscle aches. + COVID 19  Clinical Impression   Patient evaluated by Physical Therapy with no further PT needs identified. Patient reports h/o asthma and feels her breathing is slightly more labored than usual. Reports uses inhalers daily and has used today. SaO2 on room air increased from 94% seated to 100% walking and remained 95% on return to sitting. PT is signing off. Thank you for this referral.     Follow Up Recommendations No PT follow up    Equipment Recommendations  None recommended by PT    Recommendations for Other Services       Precautions / Restrictions Precautions Precautions: None Restrictions Weight Bearing Restrictions: No      Mobility  Bed Mobility Overal bed mobility: Independent                Transfers Overall transfer level: Independent                  Ambulation/Gait Ambulation/Gait assistance: Supervision;Modified independent (Device/Increase time) Gait Distance (Feet): 220 Feet Assistive device: IV Pole Gait Pattern/deviations: WFL(Within Functional Limits)     General Gait Details: pt preferred to push IV pole not for balance but to "keep it clear from my feet"  Stairs            Wheelchair Mobility    Modified Rankin (Stroke Patients Only)       Balance Overall balance assessment: No apparent balance deficits (not formally assessed)(pt denies balance issues PTA)                                           Pertinent Vitals/Pain Pain  Assessment: No/denies pain    Home Living Family/patient expects to be discharged to:: Private residence Living Arrangements: Alone                    Prior Function Level of Independence: Independent         Comments: works at [employer]        Extremity/Trunk Assessment   Upper Extremity Assessment Upper Extremity Assessment: Overall WFL for tasks assessed    Lower Extremity Assessment Lower Extremity Assessment: Overall WFL for tasks assessed    Cervical / Trunk Assessment Cervical / Trunk Assessment: Normal  Communication   Communication: No difficulties  Cognition Arousal/Alertness: Awake/alert Behavior During Therapy: WFL for tasks assessed/performed Overall Cognitive Status: Within Functional Limits for tasks assessed                                        General Comments      Exercises     Assessment/Plan    PT Assessment Patent does not need any further PT services  PT Problem List         PT Treatment Interventions  PT Goals (Current goals can be found in the Care Plan section)  Acute Rehab PT Goals Patient Stated Goal: return home alone PT Goal Formulation: All assessment and education complete, DC therapy    Frequency     Barriers to discharge        Co-evaluation               AM-PAC PT "6 Clicks" Mobility  Outcome Measure Help needed turning from your back to your side while in a flat bed without using bedrails?: None Help needed moving from lying on your back to sitting on the side of a flat bed without using bedrails?: None Help needed moving to and from a bed to a chair (including a wheelchair)?: None Help needed standing up from a chair using your arms (e.g., wheelchair or bedside chair)?: None Help needed to walk in hospital room?: None Help needed climbing 3-5 steps with a railing? : None 6 Click Score: 24    End of Session   Activity Tolerance: Patient tolerated  treatment well Patient left: in bed;with call bell/phone within reach Nurse Communication: Mobility status;Other (comment)(SaO2 WNL; RN reported pt up to bathroom ind'ly numerous time) PT Visit Diagnosis: Difficulty in walking, not elsewhere classified (R26.2)    Time: [id number] PT Time Calculation (min) (ACUTE ONLY): 25 min   Charges:   PT Evaluation $PT Eval Low Complexity: 1 Low            [employer]ynn P Sevyn Markham, PT 08/30/2018, 12:38 PM

## 2018-08-30 NOTE — Plan of Care (Signed)
Patient continue to be anxious. Walking the halls, hyperventilating and struggling to stay still or in her room. Dr. Text to notify. Orders placed.

## 2018-08-30 NOTE — Plan of Care (Signed)
Dr. Text to notify of patient's request/need for anxiety psych meds. However, Dr. Has not rounded yet. No further orders at this time.

## 2018-08-30 NOTE — TOC Initial Note (Signed)
Transition of Care [facility]) - Initial/Assessment Note    Patient Details  Name: Lauren Cummings MRN: [id number] Date of Birth: 1960-06-17  Transition of Care [facility]) CM/SW Contact:    Colleen Can RN, BSN, NCM-BC, ACM-RN [phone] (working remotely) Phone Number: 08/30/2018, 5:16 PM  Clinical Narrative:                 CM following for dispositional needs. Patient lives at home alone, recently relocated from [place] to [facility], and is independent with her ADLs. Patient started the Medicaid process; status still pending, has been without her medications for >30days. CM arranged a hospital televisit f/u appointment at: [facility] on 09/06/18 @ 1530; AVS updated. CM team will follow for [facility] assist.   Expected Discharge Plan: Home/Self Care Barriers to Discharge: Continued Medical Work up   Patient Goals and CMS Choice     Choice offered to / list presented to : NA  Expected Discharge Plan and Services Expected Discharge Plan: Home/Self Care In-house Referral: PCP / [facility] Discharge Planning Services: Follow-up appt scheduled, CM Consult, [facility]   Living arrangements for the past 2 months: Apartment Expected Discharge Date: 09/02/18                 Prior Living Arrangements/Services Living arrangements for the past 2 months: Apartment Lives with:: Self    Activities of Daily Living Home Assistive Devices/Equipment: Eyeglasses ADL Screening (condition at time of admission) Patient's cognitive ability adequate to safely complete daily activities?: Yes Is the patient deaf or have difficulty hearing?: No Does the patient have difficulty seeing, even when wearing glasses/contacts?: No Does the patient have difficulty concentrating, remembering, or making decisions?: No Patient able to express need for assistance with ADLs?: Yes Does the patient have difficulty dressing or bathing?: No Independently performs ADLs?: Yes (appropriate for developmental age) Does the patient have  difficulty walking or climbing stairs?: No Weakness of Legs: None Weakness of Arms/Hands: None   Admission diagnosis:  Pneumonia due to COVID-19 virus [U07.1, J12.89] Patient Active Problem List   Diagnosis Date Noted  . Pneumonia due to COVID-19 virus 08/29/2018  . Hyperglycemia 08/29/2018   PCP:  Patient, No Pcp Per Pharmacy:   [facility] [phone] - [place], [place] - [address] AT [facility] [address] [place] [address] Phone: [phone] Fax: [phone]     Social Determinants of Health (SDOH) Interventions    Readmission Risk Interventions No flowsheet data found.

## 2018-08-31 LAB — C-REACTIVE PROTEIN: CRP: 10.9 mg/dL — ABNORMAL HIGH (ref <1.0)

## 2018-08-31 LAB — D-DIMER, QUANTITATIVE: D-Dimer, Quant: 0.55 ug/mL-FEU — ABNORMAL HIGH (ref 0.00–0.50)

## 2018-08-31 LAB — LACTATE DEHYDROGENASE: LDH: 248 U/L — ABNORMAL HIGH (ref 98–192)

## 2018-08-31 MED ORDER — ZIPRASIDONE HCL 60 MG PO CAPS
60.0000 mg | ORAL_CAPSULE | Freq: Two times a day (BID) | ORAL | Status: DC
  Administered 2018-08-31: 60 mg via ORAL
  Filled 2018-08-31 (×2): qty 1

## 2018-08-31 MED ORDER — SERTRALINE HCL 50 MG PO TABS
100.0000 mg | ORAL_TABLET | Freq: Every day | ORAL | Status: DC
  Administered 2018-08-31: 10:00:00 100 mg via ORAL
  Filled 2018-08-31 (×2): qty 2

## 2018-08-31 MED ORDER — DOXEPIN HCL 25 MG PO CAPS: 25.0000 mg | ORAL_CAPSULE | Freq: Every day | ORAL | Status: DC

## 2018-08-31 MED ORDER — CANAGLIFLOZIN 100 MG PO TABS: 100.0000 mg | ORAL_TABLET | Freq: Every day | ORAL | Status: DC

## 2018-08-31 MED ORDER — CANAGLIFLOZIN 100 MG PO TABS
100.0000 mg | ORAL_TABLET | Freq: Every day | ORAL | Status: DC
  Filled 2018-08-31: qty 1

## 2018-08-31 MED ORDER — ARIPIPRAZOLE 5 MG PO TABS
5.0000 mg | ORAL_TABLET | Freq: Every day | ORAL | Status: DC
  Administered 2018-08-31: 5 mg via ORAL
  Filled 2018-08-31: qty 1

## 2018-08-31 MED ORDER — ERTUGLIFLOZIN L-PYROGLUTAMICAC 5 MG PO TABS: 5.0000 mg | ORAL_TABLET | Freq: Every day | ORAL | Status: DC

## 2018-08-31 NOTE — Discharge Summary (Signed)
Physician Discharge Summary  Lauren Cummings DOB: Oct 06, 1960 DOA: 08/29/2018  PCP: Patient, No Pcp Per  Admit date: 08/29/2018 Discharge date: 08/31/2018  Admitted From: Home Disposition:  Home  Recommendations for Outpatient Follow-up:  1. Follow up with PCP in 1-2 weeks 2. Please obtain BMP/CBC in one week   Home Health:No Equipment/Devices:None  Discharge Condition:Stable CODE STATUS:Full Diet recommendation: Heart Healthy  Brief/Interim Summary: 58 y.o. female no past medical history is been experiencing nausea vomiting diarrhea and shortness of breath that started 4 days prior to admission.  She relates her son and daughter were visiting from [place] about a week prior to admission.  Started having fevers fatigue and muscle aches.   Discharge Diagnoses:  Principal Problem:   Pneumonia due to COVID-19 virus Active Problems:   Hyperglycemia  Severe nausea vomiting and diarrhea due to COVID-19 pneumonia: Patient was not hypoxic on admission she was having fevers which Tylenol helped. Chest x-ray did appear to show multifocal infiltrates. She was started on aggressive IV fluid hydration, she was a started on Zofran and Imodium and her diarrhea resolved.  Hyperglycemia: Likely due to stress demargination, she was started on sliding scale insulin, she has not received insulin in the last 12 hours. Need to follow-up with her PCP and check hemoglobin A1c in 2 CBGs fasting in the morning.    Discharge Instructions  Discharge Instructions    Diet - low sodium heart healthy   Complete by:  As directed    Increase activity slowly   Complete by:  As directed      Allergies as of 08/31/2018      Reactions   Keflex [cephalexin] Anaphylaxis   Penicillins Anaphylaxis   Did it involve swelling of the face/tongue/throat, SOB, or low BP? yes Did it involve sudden or severe rash/hives, skin peeling, or any reaction on the inside of your mouth or nose? no Did you  need to seek medical attention at a hospital or doctor's office? yes When did it last happen?8 years ago If all above answers are "NO", may proceed with cephalosporin use.      Medication List    TAKE these medications   Admelog 100 UNIT/ML injection Generic drug:  insulin lispro Inject 12 Units into the skin 3 (three) times daily with meals.   amLODipine 10 MG tablet Commonly known as:  NORVASC Take 10 mg by mouth daily.   ARIPiprazole 5 MG tablet Commonly known as:  ABILIFY Take 5 mg by mouth daily.   aspirin EC 81 MG tablet Take 81 mg by mouth daily.   diazepam 5 MG tablet Commonly known as:  VALIUM Take 5 mg by mouth every 12 (twelve) hours as needed for anxiety.   doxepin 25 MG capsule Commonly known as:  SINEQUAN Take 25 mg by mouth at bedtime.   Fluticasone-Salmeterol 250-50 MCG/DOSE Aepb Commonly known as:  ADVAIR Inhale 1 puff into the lungs 2 (two) times daily.   hydrOXYzine 50 MG tablet Commonly known as:  ATARAX/VISTARIL Take 100 mg by mouth at bedtime.   insulin glargine 100 UNIT/ML injection Commonly known as:  LANTUS Inject 8-24 Units into the skin See admin instructions. Take 8 units TID w/ meals and 24 units QHS   metFORMIN 1000 MG tablet Commonly known as:  GLUCOPHAGE Take 1,000 mg by mouth 2 (two) times daily with a meal.   oxcarbazepine 600 MG tablet Commonly known as:  TRILEPTAL Take 1,200 mg by mouth at bedtime.   sertraline 100  MG tablet Commonly known as:  ZOLOFT Take 100 mg by mouth daily.   Steglatro 5 MG Tabs Generic drug:  Ertugliflozin L-PyroglutamicAc Take 5 mg by mouth daily.   ziprasidone 60 MG capsule Commonly known as:  GEODON Take 60 mg by mouth 2 (two) times daily with a meal.      Follow-up Information    [facility] Follow up on 09/06/2018.   Why:  Telephone visit: hospitla follow-up at 3:30pm  Contact information: [place]  [id number] [phone]         Allergies  Allergen Reactions  . Keflex [Cephalexin] Anaphylaxis  . Penicillins Anaphylaxis    Did it involve swelling of the face/tongue/throat, SOB, or low BP? yes Did it involve sudden or severe rash/hives, skin peeling, or any reaction on the inside of your mouth or nose? no Did you need to seek medical attention at a hospital or doctor's office? yes When did it last happen?8 years ago If all above answers are "NO", may proceed with cephalosporin use.    Consultations:  None   Procedures/Studies: Dg Chest Port 1 View  Result Date: 08/29/2018 CLINICAL DATA:  Shortness of breath EXAM: PORTABLE CHEST 1 VIEW COMPARISON:  None. FINDINGS: There are multifocal airspace opacities involving all lung fields. No pneumothorax. No large pleural effusion. The heart size is enlarged. There is no acute osseous abnormality. IMPRESSION: 1. Multifocal airspace opacities concerning for developing multifocal pneumonia (viral or bacterial) or pulmonary edema. 2. Cardiomegaly. Electronically Signed   By: Constance Holster M.D.   On: 08/29/2018 19:34     Subjective: No complains  Discharge Exam: Vitals:   08/31/18 0000 08/31/18 0830  BP: 133/86   Pulse:    Resp: 20   Temp: 99 F (37.2 C) 97.7 F (36.5 C)  SpO2:       General: Pt is alert, awake, not in acute distress Cardiovascular: RRR, S1/S2 +, no rubs, no gallops Respiratory: CTA bilaterally, no wheezing, no rhonchi Abdominal: Soft, NT, ND, bowel sounds + Extremities: no edema, no cyanosis    The results of significant diagnostics from this hospitalization (including imaging, microbiology, ancillary and laboratory) are listed below for reference.     Microbiology: Recent Results (from the past 240 hour(s))  Blood Culture (routine x 2)     Status: None (Preliminary result)   Collection Time: 08/29/18  6:50 PM  Result Value Ref Range Status   Specimen Description BLOOD LEFT ANTECUBITAL   Final   Special Requests   Final    BOTTLES DRAWN AEROBIC AND ANAEROBIC Blood Culture adequate volume   Culture   Final    NO GROWTH 2 DAYS Performed at [facility], [address] [place], [place] [address]    Report Status PENDING  Incomplete  SARS Coronavirus 2 [facility] order, Performed in [place] hospital lab)     Status: Abnormal   Collection Time: 08/29/18  7:10 PM  Result Value Ref Range Status   SARS Coronavirus 2 POSITIVE (A) NEGATIVE Final    Comment: RESULT CALLED TO, READ BACK BY AND VERIFIED WITH: Wyvonna Plum RN 08/29/18 2041 JDW (NOTE) If result is NEGATIVE SARS-CoV-2 target nucleic acids are NOT DETECTED. The SARS-CoV-2 RNA is generally detectable in upper and lower  respiratory specimens during the acute phase of infection. The lowest  concentration of SARS-CoV-2 viral copies this assay can detect is 250  copies / mL. A negative result does not preclude SARS-CoV-2  infection  and should not be used as the sole basis for treatment or other  patient management decisions.  A negative result may occur with  improper specimen collection / handling, submission of specimen other  than nasopharyngeal swab, presence of viral mutation(s) within the  areas targeted by this assay, and inadequate number of viral copies  (<250 copies / mL). A negative result must be combined with clinical  observations, patient history, and epidemiological information. If result is POSITIVE SARS-CoV-2 target nucleic acids are DETECTED. The SARS- CoV-2 RNA is generally detectable in upper and lower  respiratory specimens during the acute phase of infection.  Positive  results are indicative of active infection with SARS-CoV-2.  Clinical  correlation with patient history and other diagnostic information is  necessary to determine patient infection status.  Positive results do  not rule out bacterial infection or co-infection with other viruses. If result is PRESUMPTIVE POSTIVE SARS-CoV-2  nucleic acids MAY BE PRESENT.   A presumptive positive result was obtained on the submitted specimen  and confirmed on repeat testing.  While 2019 novel coronavirus  (SARS-CoV-2) nucleic acids may be present in the submitted sample  additional confirmatory testing may be necessary for epidemiological  and / or clinical management purposes  to differentiate between  SARS-CoV-2 and other Sarbecovirus currently known to infect humans.  If clinically indicated additional testing with an alternate test  methodology [phone]([id number]) is advised . The SARS-CoV-2 RNA is generally  detectable in upper and lower respiratory specimens during the acute  phase of infection. The expected result is Negative. Fact Sheet for Patients:  BoilerBrush.com.cyhttps://www.fda.gov/media/136312/download Fact Sheet for Healthcare Providers: https://pope.com/https://www.fda.gov/media/136313/download This test is not yet approved or cleared by the [place] FDA and has been authorized for detection and/or diagnosis of SARS-CoV-2 by FDA under an Emergency Use Authorization (EUA).  This EUA will remain in effect (meaning this test can be used) for the duration of the COVID-19 declaration under Section 564(b)(1) of the Act, 21 U.S.C. section 360bbb-3(b)(1), unless the authorization is terminated or revoked sooner. Performed at [facility], [address] [place], [place] [address]   Blood Culture (routine x 2)     Status: None (Preliminary result)   Collection Time: 08/29/18  7:10 PM  Result Value Ref Range Status   Specimen Description BLOOD RIGHT ANTECUBITAL  Final   Special Requests   Final    BOTTLES DRAWN AEROBIC AND ANAEROBIC Blood Culture adequate volume   Culture   Final    NO GROWTH 2 DAYS Performed at [facility], [address] [place], [place] [address]    Report Status PENDING  Incomplete     Labs: BNP (last 3 results) No results for input(s): BNP in the last 8760 hours. Basic Metabolic Panel: Recent Labs  Lab  08/29/18 1852 08/29/18 2343 08/30/18 0720  NA 133*  --   --   K 3.3*  --   --   CL 97*  --   --   CO2 23  --   --   GLUCOSE 167*  --   --   BUN 9  --   --   CREATININE 0.71 0.86  --   CALCIUM 8.7*  --   --   MG  --   --  1.7   Liver Function Tests: Recent Labs  Lab 08/29/18 1852  AST 39  ALT 24  ALKPHOS 121  BILITOT 0.8  PROT 7.4  ALBUMIN 3.4*   No results  for input(s): LIPASE, AMYLASE in the last 168 hours. No results for input(s): AMMONIA in the last 168 hours. CBC: Recent Labs  Lab 08/29/18 1852 08/29/18 2343  WBC 6.8 6.1  NEUTROABS 6.1  --   HGB 12.8 12.3  HCT 38.2 36.7  MCV 79.6* 79.6*  PLT 270 254   Cardiac Enzymes: Recent Labs  Lab 08/30/18 0720  TROPONINI <0.03   BNP: Invalid input(s): POCBNP CBG: No results for input(s): GLUCAP in the last 168 hours. D-Dimer Recent Labs    08/30/18 0720 08/31/18 0230  DDIMER 0.66* 0.55*   Hgb A1c No results for input(s): HGBA1C in the last 72 hours. Lipid Profile Recent Labs    08/29/18 1852  TRIG 109   Thyroid function studies No results for input(s): TSH, T4TOTAL, T3FREE, THYROIDAB in the last 72 hours.  Invalid input(s): FREET3 Anemia work up [facility]    08/29/18 1915  FERRITIN 108   Urinalysis No results found for: COLORURINE, APPEARANCEUR, LABSPEC, PHURINE, GLUCOSEU, HGBUR, BILIRUBINUR, KETONESUR, PROTEINUR, UROBILINOGEN, NITRITE, LEUKOCYTESUR Sepsis Labs Invalid input(s): PROCALCITONIN,  WBC,  LACTICIDVEN Microbiology Recent Results (from the past 240 hour(s))  Blood Culture (routine x 2)     Status: None (Preliminary result)   Collection Time: 08/29/18  6:50 PM  Result Value Ref Range Status   Specimen Description BLOOD LEFT ANTECUBITAL  Final   Special Requests   Final    BOTTLES DRAWN AEROBIC AND ANAEROBIC Blood Culture adequate volume   Culture   Final    NO GROWTH 2 DAYS Performed at [facility], [address] [place], [place] [address]    Report Status PENDING   Incomplete  SARS Coronavirus 2 [facility](Hospital order, Performed in [facility] hospital lab)     Status: Abnormal   Collection Time: 08/29/18  7:10 PM  Result Value Ref Range Status   SARS Coronavirus 2 POSITIVE (A) NEGATIVE Final    Comment: RESULT CALLED TO, READ BACK BY AND VERIFIED WITH: Minus BreedingY LEBRON RN 08/29/18 2041 JDW (NOTE) If result is NEGATIVE SARS-CoV-2 target nucleic acids are NOT DETECTED. The SARS-CoV-2 RNA is generally detectable in upper and lower  respiratory specimens during the acute phase of infection. The lowest  concentration of SARS-CoV-2 viral copies this assay can detect is 250  copies / mL. A negative result does not preclude SARS-CoV-2 infection  and should not be used as the sole basis for treatment or other  patient management decisions.  A negative result may occur with  improper specimen collection / handling, submission of specimen other  than nasopharyngeal swab, presence of viral mutation(s) within the  areas targeted by this assay, and inadequate number of viral copies  (<250 copies / mL). A negative result must be combined with clinical  observations, patient history, and epidemiological information. If result is POSITIVE SARS-CoV-2 target nucleic acids are DETECTED. The SARS- CoV-2 RNA is generally detectable in upper and lower  respiratory specimens during the acute phase of infection.  Positive  results are indicative of active infection with SARS-CoV-2.  Clinical  correlation with patient history and other diagnostic information is  necessary to determine patient infection status.  Positive results do  not rule out bacterial infection or co-infection with other viruses. If result is PRESUMPTIVE POSTIVE SARS-CoV-2 nucleic acids MAY BE PRESENT.   A presumptive positive result was obtained on the submitted specimen  and confirmed on repeat testing.  While 2019 novel coronavirus  (SARS-CoV-2) nucleic acids may be present in the submitted sample  additional  confirmatory testing may be necessary for epidemiological  and / or clinical management purposes  to differentiate between  SARS-CoV-2 and other Sarbecovirus currently known to infect humans.  If clinically indicated additional testing with an alternate test  methodology [phone]) is advised . The SARS-CoV-2 RNA is generally  detectable in upper and lower respiratory specimens during the acute  phase of infection. The expected result is Negative. Fact Sheet for Patients:  BoilerBrush.com.cy Fact Sheet for Healthcare Providers: https://pope.com/ This test is not yet approved or cleared by the [place] FDA and has been authorized for detection and/or diagnosis of SARS-CoV-2 by FDA under an Emergency Use Authorization (EUA).  This EUA will remain in effect (meaning this test can be used) for the duration of the COVID-19 declaration under Section 564(b)(1) of the Act, 21 U.S.C. section 360bbb-3(b)(1), unless the authorization is terminated or revoked sooner. Performed at [facility], [address], [place], [place] [address]   Blood Culture (routine x 2)     Status: None (Preliminary result)   Collection Time: 08/29/18  7:10 PM  Result Value Ref Range Status   Specimen Description BLOOD RIGHT ANTECUBITAL  Final   Special Requests   Final    BOTTLES DRAWN AEROBIC AND ANAEROBIC Blood Culture adequate volume   Culture   Final    NO GROWTH 2 DAYS Performed at [facility], [address], [place], [place] [address]    Report Status PENDING  Incomplete     Time coordinating discharge: 40 minutes  SIGNED:   Marinda Elk, MD  [facility]

## 2018-08-31 NOTE — Progress Notes (Signed)
CSW has called [facility] for transport to patient's address listed in chart. No further discharge needs identified.   [place], Riva

## 2018-08-31 NOTE — Progress Notes (Signed)
Reviewed discharge instructions with patient. Gave patient a thermometer and informed patient of her follow up appointment. [facility] took patient home.

## 2018-09-03 LAB — CULTURE, BLOOD (ROUTINE X 2)
Culture: NO GROWTH
Culture: NO GROWTH
Special Requests: ADEQUATE
Special Requests: ADEQUATE

## 2018-09-06 ENCOUNTER — Other Ambulatory Visit: Payer: Self-pay

## 2018-09-06 ENCOUNTER — Ambulatory Visit: Payer: HRSA Program | Attending: Internal Medicine | Admitting: Internal Medicine

## 2018-09-06 DIAGNOSIS — I10 Essential (primary) hypertension: Secondary | ICD-10-CM | POA: Insufficient documentation

## 2018-09-06 DIAGNOSIS — F209 Schizophrenia, unspecified: Secondary | ICD-10-CM

## 2018-09-06 DIAGNOSIS — U071 COVID-19: Secondary | ICD-10-CM | POA: Diagnosis not present

## 2018-09-06 DIAGNOSIS — IMO0001 Reserved for inherently not codable concepts without codable children: Secondary | ICD-10-CM

## 2018-09-06 DIAGNOSIS — E1165 Type 2 diabetes mellitus with hyperglycemia: Secondary | ICD-10-CM | POA: Diagnosis not present

## 2018-09-06 DIAGNOSIS — J1282 Pneumonia due to coronavirus disease 2019: Secondary | ICD-10-CM

## 2018-09-06 DIAGNOSIS — J1289 Other viral pneumonia: Secondary | ICD-10-CM | POA: Diagnosis not present

## 2018-09-06 DIAGNOSIS — Z09 Encounter for follow-up examination after completed treatment for conditions other than malignant neoplasm: Secondary | ICD-10-CM

## 2018-09-06 DIAGNOSIS — F319 Bipolar disorder, unspecified: Secondary | ICD-10-CM | POA: Insufficient documentation

## 2018-09-06 NOTE — Progress Notes (Signed)
Pt states her blood sugar this morning was 270   Pt states she is feeling really good since being discharge

## 2018-09-06 NOTE — Progress Notes (Signed)
Virtual Visit via Telephone Note Due to current restrictions/limitations of in-office visits due to the COVID-19 pandemic, this scheduled clinical appointment was converted to a telehealth visit  I connected with Lauren Cummings on 09/06/18 at  3:53 p.m EDTby telephone and verified that I am speaking with the correct person using two identifiers. I am in my office.  The patient is at home.  Only the patient and myself participated in this encounter.  Of note patient intermittently would interrupt our conversation to talk to other persons in the background.  I discussed the limitations, risks, security and privacy concerns of performing an evaluation and management service by telephone and the availability of in person appointments. I also discussed with the patient that there may be a patient responsible charge related to this service. The patient expressed understanding and agreed to proceed.   History of Present Illness: Pt with hx of DM, HTN, asthma, schizophrenia and bipolar ds.  This was a new patient visit for hospital follow-up and to establish care.  Patient hospitalized 6/4- 08/2018 with COVID-19 infection.  Lauren Cummings presented with vomiting, diarrhea and shortness of breath for 4 days prior to admission.  Chest x-ray revealed multifocal infiltrates consistent with COVID-19 pneumonia.  After supportive care patient was discharged home.  Patient reports that Lauren Cummings is doing well.  Vomiting and diarrhea have resolved.  Lauren Cummings has no shortness of breath or fever.  [place]oved [place] in Dec [place]. Moved here permanently in 04/2018.  Lauren Cummings has [place] Medicaid.  Lauren Cumm[facility] so that Lauren Cummings can get in for specialty care.  DM:  Suppose to be Lantus 20 units BID, Humalog 15 TID.  Taking Humalog once a day at dinner, and Lantus 10 units twice daily to make them last as Lauren Cummings is running short -check BS QID.  Reports that up to about 2 weeks ago morning blood sugars were  running around 110 and after meal blood sugars were around 160.  Recently blood sugars have been in the 200s which Lauren Cummings attributes to stress and having to stretch out her medications.  Blood sugar this morning was 276  -eating habits about same. Does not eat during the day. Lauren Cummings was 338 lbs last yr now down to 240 lbs.  Weight loss is intentional. Walks 2 x a day  HTN: On amlodipine.  Reports that blood pressures have been good.  Lauren Cummings limits salt in the foods.  Give significant mental health history of schizophrenia and bipolar disorder.  Lauren Cummings is on several psychiatric medications including Abilify, Valium, doxepin, Trileptal, Zoloft and Geodon.  Lauren Cummings reports that Lauren Cummings has been out of these medicines for the past 1-1/2 to 2 weeks.  Lauren Cummings is hoping to get established with a psychiatrist [facility].  Outpatient Encounter Medications as of 09/06/2018  Medication Sig  . amLODipine (NORVASC) 10 MG tablet Take 10 mg by mouth daily.  . ARIPiprazole (ABILIFY) 5 MG tablet Take 5 mg by mouth daily.  Marland Kitchen. aspirin EC 81 MG tablet Take 81 mg by mouth daily.  . diazepam (VALIUM) 5 MG tablet Take 5 mg by mouth every 12 (twelve) hours as needed for anxiety.  Marland Kitchen. doxepin (SINEQUAN) 25 MG capsule Take 25 mg by mouth at bedtime.  Marland Kitchen. Ertugliflozin L-PyroglutamicAc (STEGLATRO) 5 MG TABS Take 5 mg by mouth daily.  . Fluticasone-Salmeterol (ADVAIR) 250-50 MCG/DOSE AEPB Inhale 1 puff into the lungs 2 (two) times daily.  .Marland Kitchen  hydrOXYzine (ATARAX/VISTARIL) 50 MG tablet Take 100 mg by mouth at bedtime.  . insulin glargine (LANTUS) 100 UNIT/ML injection Inject 8-24 Units into the skin See admin instructions. Take 8 units TID w/ meals and 24 units QHS  . insulin lispro (ADMELOG) 100 UNIT/ML injection Inject 12 Units into the skin 3 (three) times daily with meals.  . metFORMIN (GLUCOPHAGE) 1000 MG tablet Take 1,000 mg by mouth 2 (two) times daily with a meal.  . oxcarbazepine (TRILEPTAL) 600 MG tablet  Take 1,200 mg by mouth at bedtime.  . sertraline (ZOLOFT) 100 MG tablet Take 100 mg by mouth daily.  . ziprasidone (GEODON) 60 MG capsule Take 60 mg by mouth 2 (two) times daily with a meal.   No facility-administered encounter medications on file as of 09/06/2018.       Observations/Objective: No direct observation done as this was a telephone encounter Lab Results  Component Value Date   WBC 6.1 08/29/2018   HGB 12.3 08/29/2018   HCT 36.7 08/29/2018   MCV 79.6 (L) 08/29/2018   PLT 254 08/29/2018     Chemistry      Component Value Date/Time   NA 133 (L) 08/29/2018 1852   K 3.3 (L) 08/29/2018 1852   CL 97 (L) 08/29/2018 1852   CO2 23 08/29/2018 1852   BUN 9 08/29/2018 1852   CREATININE 0.86 08/29/2018 2343      Component Value Date/Time   CALCIUM 8.7 (L) 08/29/2018 1852   ALKPHOS 121 08/29/2018 1852   AST 39 08/29/2018 1852   ALT 24 08/29/2018 1852   BILITOT 0.8 08/29/2018 1852     No results found for: HGBA1C    Assessment and Plan: 1. Hospital discharge follow-up 2. Pneumonia due to COVID-19 virus Patient has recovered well.  Encouraged her to wear a mask in public and continue practice social distancing.  3. Diabetes mellitus type 2, uncontrolled, without complications (HCC) We will refill her medications and see whether Lauren Cummings can get it through our pharmacy - metFORMIN (GLUCOPHAGE) 1000 MG tablet; Take 1 tablet (1,000 mg total) by mouth 2 (two) times daily with a meal.  Dispense: 60 tablet; Refill: 6 - Ertugliflozin L-PyroglutamicAc (STEGLATRO) 5 MG TABS; Take 5 mg by mouth daily.  Dispense: 30 tablet; Refill: 6 - insulin glargine (LANTUS) 100 UNIT/ML injection; Inject 0.2 mLs (20 Units total) into the skin 2 (two) times daily.  Dispense: 20 mL; Refill: 6  4. Essential hypertension Level of control unknown.  DASH diet encouraged.  Refill Norvasc - amLODipine (NORVASC) 10 MG tablet; Take 1 tablet (10 mg total) by mouth daily.  Dispense: 30 tablet; Refill: 6  5.  Schizophrenia, unspecified type ([place]) We will refill her medications for 1 to 2 months until Lauren Cummings is able to establish with a psychiatrist.  However informed her that I will not be refilling prescription for Valium and patient was okay with this. - oxcarbazepine (TRILEPTAL) 600 MG tablet; Take 2 tablets (1,200 mg total) by mouth at bedtime.  Dispense: 60 tablet; Refill: 1 - hydrOXYzine (ATARAX/VISTARIL) 50 MG tablet; Take 2 tablets (100 mg total) by mouth at bedtime.  Dispense: 30 tablet; Refill: 0 - doxepin (SINEQUAN) 25 MG capsule; Take 1 capsule (25 mg total) by mouth at bedtime.  Dispense: 30 capsule; Refill: 1 - ARIPiprazole (ABILIFY) 5 MG tablet; Take 1 tablet (5 mg total) by mouth daily.  Dispense: 30 tablet; Refill: 1 - ziprasidone (GEODON) 60 MG capsule; Take 1 capsule (60 mg total) by mouth 2 (two)  times daily with a meal.  Dispense: 60 capsule; Refill: 1  6. Bipolar affective disorder, remission status unspecified (HCC) - oxcarbazepine (TRILEPTAL) 600 MG tablet; Take 2 tablets (1,200 mg total) by mouth at bedtime.  Dispense: 60 tablet; Refill: 1 - sertraline (ZOLOFT) 100 MG tablet; Take 1 tablet (100 mg total) by mouth daily.  Dispense: 30 tablet; Refill: 1 - hydrOXYzine (ATARAX/VISTARIL) 50 MG tablet; Take 2 tablets (100 mg total) by mouth at bedtime.  Dispense: 30 tablet; Refill: 0 - doxepin (SINEQUAN) 25 MG capsule; Take 1 capsule (25 mg total) by mouth at bedtime.  Dispense: 30 capsule; Refill: 1 - ARIPiprazole (ABILIFY) 5 MG tablet; Take 1 tablet (5 mg total) by mouth daily.  Dispense: 30 tablet; Refill: 1 - ziprasidone (GEODON) 60 MG capsule; Take 1 capsule (60 mg total) by mouth 2 (two) times daily with a meal.  Dispense: 60 capsule; Refill: 1   Follow Up Instructions: F/u in 1 mth  I discussed the assessment and treatment plan with the patient. The patient was provided an opportunity to ask questions and all were answered. The patient agreed with the plan and demonstrated an  understanding of the instructions.   The patient was advised to call back or seek an in-person evaluation if the symptoms worsen or if the condition fails to improve as anticipated.  I provided 18 minutes of non-face-to-face time during this encounter.   Jonah Blueeborah , MD

## 2018-09-09 ENCOUNTER — Encounter: Payer: Self-pay | Admitting: Internal Medicine

## 2018-09-09 ENCOUNTER — Telehealth: Payer: Self-pay | Admitting: Internal Medicine

## 2018-09-09 MED ORDER — DOXEPIN HCL 25 MG PO CAPS: 25.0000 mg | ORAL_CAPSULE | Freq: Every day | ORAL | 1 refills | Status: AC

## 2018-09-09 MED ORDER — SERTRALINE HCL 100 MG PO TABS: 100.0000 mg | ORAL_TABLET | Freq: Every day | ORAL | 1 refills | Status: AC

## 2018-09-09 MED ORDER — OXCARBAZEPINE 600 MG PO TABS: 1200.0000 mg | ORAL_TABLET | Freq: Every day | ORAL | 1 refills | Status: AC

## 2018-09-09 MED ORDER — INSULIN GLARGINE 100 UNIT/ML ~~LOC~~ SOLN: 20.0000 [IU] | Freq: Two times a day (BID) | SUBCUTANEOUS | 6 refills | Status: AC

## 2018-09-09 MED ORDER — AMLODIPINE BESYLATE 10 MG PO TABS: 10.0000 mg | ORAL_TABLET | Freq: Every day | ORAL | 6 refills | Status: DC

## 2018-09-09 MED ORDER — STEGLATRO 5 MG PO TABS: 5.0000 mg | ORAL_TABLET | Freq: Every day | ORAL | 6 refills | Status: AC

## 2018-09-09 MED ORDER — METFORMIN HCL 1000 MG PO TABS: 1000.0000 mg | ORAL_TABLET | Freq: Two times a day (BID) | ORAL | 6 refills | Status: AC

## 2018-09-09 MED ORDER — HYDROXYZINE HCL 50 MG PO TABS: 100.0000 mg | ORAL_TABLET | Freq: Every day | ORAL | 0 refills | Status: AC

## 2018-09-09 MED ORDER — ZIPRASIDONE HCL 60 MG PO CAPS: 60.0000 mg | ORAL_CAPSULE | Freq: Two times a day (BID) | ORAL | 1 refills | Status: AC

## 2018-09-09 MED ORDER — ARIPIPRAZOLE 5 MG PO TABS: 5.0000 mg | ORAL_TABLET | Freq: Every day | ORAL | 1 refills | Status: AC

## 2018-09-09 NOTE — Telephone Encounter (Signed)
Attempted to reach patient unable to reach LVM to call back to schedule a 5 week in person for the morning time

## 2018-10-05 ENCOUNTER — Emergency Department (HOSPITAL_COMMUNITY): Payer: Medicaid - Out of State

## 2018-10-05 ENCOUNTER — Encounter (HOSPITAL_COMMUNITY): Payer: Self-pay

## 2018-10-05 ENCOUNTER — Other Ambulatory Visit: Payer: Self-pay

## 2018-10-05 ENCOUNTER — Inpatient Hospital Stay (HOSPITAL_COMMUNITY)
Admission: EM | Admit: 2018-10-05 | Discharge: 2018-10-12 | DRG: 177 | Disposition: A | Discharge status: Home / Self Care | Payer: Medicaid - Out of State | Attending: Internal Medicine | Admitting: Internal Medicine

## 2018-10-05 DIAGNOSIS — J189 Pneumonia, unspecified organism: Secondary | ICD-10-CM | POA: Diagnosis not present

## 2018-10-05 DIAGNOSIS — F319 Bipolar disorder, unspecified: Secondary | ICD-10-CM | POA: Diagnosis present

## 2018-10-05 DIAGNOSIS — I480 Paroxysmal atrial fibrillation: Secondary | ICD-10-CM | POA: Diagnosis present

## 2018-10-05 DIAGNOSIS — Z79899 Other long term (current) drug therapy: Secondary | ICD-10-CM | POA: Diagnosis not present

## 2018-10-05 DIAGNOSIS — Z7982 Long term (current) use of aspirin: Secondary | ICD-10-CM | POA: Diagnosis not present

## 2018-10-05 DIAGNOSIS — Z9114 Patient's other noncompliance with medication regimen: Secondary | ICD-10-CM

## 2018-10-05 DIAGNOSIS — T380X5A Adverse effect of glucocorticoids and synthetic analogues, initial encounter: Secondary | ICD-10-CM | POA: Diagnosis not present

## 2018-10-05 DIAGNOSIS — E1165 Type 2 diabetes mellitus with hyperglycemia: Secondary | ICD-10-CM | POA: Diagnosis not present

## 2018-10-05 DIAGNOSIS — I248 Other forms of acute ischemic heart disease: Secondary | ICD-10-CM | POA: Diagnosis not present

## 2018-10-05 DIAGNOSIS — I959 Hypotension, unspecified: Secondary | ICD-10-CM | POA: Diagnosis present

## 2018-10-05 DIAGNOSIS — I252 Old myocardial infarction: Secondary | ICD-10-CM

## 2018-10-05 DIAGNOSIS — Z880 Allergy status to penicillin: Secondary | ICD-10-CM

## 2018-10-05 DIAGNOSIS — Z881 Allergy status to other antibiotic agents status: Secondary | ICD-10-CM

## 2018-10-05 DIAGNOSIS — I110 Hypertensive heart disease with heart failure: Secondary | ICD-10-CM | POA: Diagnosis present

## 2018-10-05 DIAGNOSIS — Z794 Long term (current) use of insulin: Secondary | ICD-10-CM

## 2018-10-05 DIAGNOSIS — I4891 Unspecified atrial fibrillation: Secondary | ICD-10-CM | POA: Diagnosis not present

## 2018-10-05 DIAGNOSIS — E1110 Type 2 diabetes mellitus with ketoacidosis without coma: Secondary | ICD-10-CM | POA: Diagnosis present

## 2018-10-05 DIAGNOSIS — Z833 Family history of diabetes mellitus: Secondary | ICD-10-CM

## 2018-10-05 DIAGNOSIS — IMO0001 Reserved for inherently not codable concepts without codable children: Secondary | ICD-10-CM | POA: Diagnosis present

## 2018-10-05 DIAGNOSIS — R079 Chest pain, unspecified: Secondary | ICD-10-CM | POA: Diagnosis not present

## 2018-10-05 DIAGNOSIS — R945 Abnormal results of liver function studies: Secondary | ICD-10-CM | POA: Diagnosis not present

## 2018-10-05 DIAGNOSIS — I10 Essential (primary) hypertension: Secondary | ICD-10-CM | POA: Diagnosis not present

## 2018-10-05 DIAGNOSIS — I509 Heart failure, unspecified: Secondary | ICD-10-CM

## 2018-10-05 DIAGNOSIS — Y95 Nosocomial condition: Secondary | ICD-10-CM | POA: Diagnosis present

## 2018-10-05 DIAGNOSIS — Z8249 Family history of ischemic heart disease and other diseases of the circulatory system: Secondary | ICD-10-CM | POA: Diagnosis not present

## 2018-10-05 DIAGNOSIS — R0600 Dyspnea, unspecified: Secondary | ICD-10-CM

## 2018-10-05 DIAGNOSIS — J9621 Acute and chronic respiratory failure with hypoxia: Secondary | ICD-10-CM | POA: Diagnosis not present

## 2018-10-05 DIAGNOSIS — J181 Lobar pneumonia, unspecified organism: Secondary | ICD-10-CM | POA: Diagnosis not present

## 2018-10-05 DIAGNOSIS — U071 COVID-19: Principal | ICD-10-CM | POA: Diagnosis present

## 2018-10-05 DIAGNOSIS — J9601 Acute respiratory failure with hypoxia: Secondary | ICD-10-CM | POA: Diagnosis present

## 2018-10-05 DIAGNOSIS — F259 Schizoaffective disorder, unspecified: Secondary | ICD-10-CM | POA: Diagnosis present

## 2018-10-05 DIAGNOSIS — R072 Precordial pain: Secondary | ICD-10-CM | POA: Diagnosis not present

## 2018-10-05 DIAGNOSIS — J1289 Other viral pneumonia: Secondary | ICD-10-CM | POA: Diagnosis present

## 2018-10-05 DIAGNOSIS — I2510 Atherosclerotic heart disease of native coronary artery without angina pectoris: Secondary | ICD-10-CM | POA: Diagnosis present

## 2018-10-05 HISTORY — DX: Schizophrenia, unspecified: F20.9

## 2018-10-05 HISTORY — DX: Acute myocardial infarction, unspecified: I21.9

## 2018-10-05 HISTORY — DX: Essential (primary) hypertension: I10

## 2018-10-05 HISTORY — DX: Type 2 diabetes mellitus without complications: E11.9

## 2018-10-05 LAB — CBC
HCT: 39.1 % (ref 36.0–46.0)
Hemoglobin: 13.4 g/dL (ref 12.0–15.0)
MCH: 27.4 pg (ref 26.0–34.0)
MCHC: 34.3 g/dL (ref 30.0–36.0)
MCV: 80 fL (ref 80.0–100.0)
Platelets: 328 10*3/uL (ref 150–400)
RBC: 4.89 MIL/uL (ref 3.87–5.11)
RDW: 13.2 % (ref 11.5–15.5)
WBC: 22.3 10*3/uL — ABNORMAL HIGH (ref 4.0–10.5)
nRBC: 0 % (ref 0.0–0.2)

## 2018-10-05 LAB — CK TOTAL AND CKMB (NOT AT ARMC)
CK, MB: 8.5 ng/mL — ABNORMAL HIGH (ref 0.5–5.0)
Relative Index: INVALID (ref 0.0–2.5)
Total CK: 45 U/L (ref 38–234)

## 2018-10-05 LAB — LACTATE DEHYDROGENASE: LDH: 200 U/L — ABNORMAL HIGH (ref 98–192)

## 2018-10-05 LAB — BRAIN NATRIURETIC PEPTIDE: B Natriuretic Peptide: 600.3 pg/mL — ABNORMAL HIGH (ref 0.0–100.0)

## 2018-10-05 LAB — TROPONIN I (HIGH SENSITIVITY)
Troponin I (High Sensitivity): 13 ng/L (ref <18)
Troponin I (High Sensitivity): 10 ng/L (ref <18)

## 2018-10-05 LAB — ABO/RH: ABO/RH(D): O POS

## 2018-10-05 LAB — COMPREHENSIVE METABOLIC PANEL
ALT: 71 U/L — ABNORMAL HIGH (ref 0–44)
AST: 80 U/L — ABNORMAL HIGH (ref 15–41)
Albumin: 2.9 g/dL — ABNORMAL LOW (ref 3.5–5.0)
Alkaline Phosphatase: 196 U/L — ABNORMAL HIGH (ref 38–126)
Anion gap: 17 — ABNORMAL HIGH (ref 5–15)
BUN: 11 mg/dL (ref 6–20)
CO2: 20 mmol/L — ABNORMAL LOW (ref 22–32)
Calcium: 9.1 mg/dL (ref 8.9–10.3)
Chloride: 94 mmol/L — ABNORMAL LOW (ref 98–111)
Creatinine, Ser: 1.31 mg/dL — ABNORMAL HIGH (ref 0.44–1.00)
GFR calc Af Amer: 53 mL/min — ABNORMAL LOW (ref >60)
GFR calc non Af Amer: 45 mL/min — ABNORMAL LOW (ref >60)
Glucose, Bld: 339 mg/dL — ABNORMAL HIGH (ref 70–99)
Potassium: 3.6 mmol/L (ref 3.5–5.1)
Sodium: 131 mmol/L — ABNORMAL LOW (ref 135–145)
Total Bilirubin: 4.1 mg/dL — ABNORMAL HIGH (ref 0.3–1.2)
Total Protein: 7.7 g/dL (ref 6.5–8.1)

## 2018-10-05 LAB — C-REACTIVE PROTEIN: CRP: 36.4 mg/dL — ABNORMAL HIGH (ref <1.0)

## 2018-10-05 LAB — CBG MONITORING, ED
Glucose-Capillary: 312 mg/dL — ABNORMAL HIGH (ref 70–99)
Glucose-Capillary: 322 mg/dL — ABNORMAL HIGH (ref 70–99)
Glucose-Capillary: 298 mg/dL — ABNORMAL HIGH (ref 70–99)
Glucose-Capillary: 270 mg/dL — ABNORMAL HIGH (ref 70–99)

## 2018-10-05 LAB — SARS CORONAVIRUS 2 BY RT PCR (HOSPITAL ORDER, PERFORMED IN ~~LOC~~ HOSPITAL LAB): SARS Coronavirus 2: POSITIVE — AB

## 2018-10-05 LAB — SEDIMENTATION RATE: Sed Rate: 80 mm/hr — ABNORMAL HIGH (ref 0–22)

## 2018-10-05 LAB — FERRITIN: Ferritin: 414 ng/mL — ABNORMAL HIGH (ref 11–307)

## 2018-10-05 LAB — PROCALCITONIN: Procalcitonin: 6.37 ng/mL

## 2018-10-05 LAB — LACTIC ACID, PLASMA: Lactic Acid, Venous: 2.1 mmol/L (ref 0.5–1.9)

## 2018-10-05 MED ORDER — VANCOMYCIN HCL IN DEXTROSE 750-5 MG/150ML-% IV SOLN
750.0000 mg | Freq: Two times a day (BID) | INTRAVENOUS | Status: DC
  Administered 2018-10-06 – 2018-10-09 (×7): 750 mg via INTRAVENOUS
  Filled 2018-10-05 (×8): qty 150

## 2018-10-05 MED ORDER — SODIUM CHLORIDE 0.9 % IV SOLN
500.0000 mg | INTRAVENOUS | Status: AC
  Administered 2018-10-05 – 2018-10-09 (×5): 500 mg via INTRAVENOUS
  Filled 2018-10-05 (×6): qty 500

## 2018-10-05 MED ORDER — INSULIN REGULAR BOLUS VIA INFUSION
0.0000 [IU] | Freq: Three times a day (TID) | INTRAVENOUS | Status: DC
  Filled 2018-10-05: qty 10

## 2018-10-05 MED ORDER — LEVOFLOXACIN IN D5W 750 MG/150ML IV SOLN: 750.0000 mg | Freq: Once | INTRAVENOUS | Status: DC

## 2018-10-05 MED ORDER — ENOXAPARIN SODIUM 40 MG/0.4ML ~~LOC~~ SOLN
40.0000 mg | SUBCUTANEOUS | Status: DC
  Administered 2018-10-06: 40 mg via SUBCUTANEOUS
  Filled 2018-10-05 (×2): qty 0.4

## 2018-10-05 MED ORDER — MORPHINE SULFATE (PF) 2 MG/ML IV SOLN
2.0000 mg | Freq: Once | INTRAVENOUS | Status: AC
  Administered 2018-10-05: 2 mg via INTRAVENOUS
  Filled 2018-10-05: qty 1

## 2018-10-05 MED ORDER — DEXTROSE-NACL 5-0.45 % IV SOLN
INTRAVENOUS | Status: DC
  Administered 2018-10-06: 03:00:00 via INTRAVENOUS

## 2018-10-05 MED ORDER — ALTEPLASE (STROKE) FULL DOSE INFUSION
90.0000 mg | Freq: Once | INTRAVENOUS | Status: DC
  Filled 2018-10-05: qty 100

## 2018-10-05 MED ORDER — SODIUM CHLORIDE 0.9 % IV SOLN: INTRAVENOUS | Status: DC

## 2018-10-05 MED ORDER — SODIUM CHLORIDE 0.9 % IV BOLUS
1000.0000 mL | Freq: Once | INTRAVENOUS | Status: AC
  Administered 2018-10-05: 1000 mL via INTRAVENOUS

## 2018-10-05 MED ORDER — DEXTROSE-NACL 5-0.45 % IV SOLN: INTRAVENOUS | Status: DC

## 2018-10-05 MED ORDER — SODIUM CHLORIDE 0.9 % IV SOLN
INTRAVENOUS | Status: DC
  Administered 2018-10-05: 21:00:00 via INTRAVENOUS

## 2018-10-05 MED ORDER — DEXTROSE 50 % IV SOLN: 25.0000 mL | INTRAVENOUS | Status: DC | PRN

## 2018-10-05 MED ORDER — IOHEXOL 350 MG/ML SOLN
100.0000 mL | Freq: Once | INTRAVENOUS | Status: AC | PRN
  Administered 2018-10-05: 100 mL via INTRAVENOUS

## 2018-10-05 MED ORDER — SODIUM CHLORIDE 0.9 % IV SOLN: 50.0000 mL | Freq: Once | INTRAVENOUS | Status: DC

## 2018-10-05 MED ORDER — SODIUM CHLORIDE 0.9 % IV SOLN
2.0000 g | Freq: Three times a day (TID) | INTRAVENOUS | Status: AC
  Administered 2018-10-05 – 2018-10-09 (×13): 2 g via INTRAVENOUS
  Filled 2018-10-05 (×15): qty 2

## 2018-10-05 MED ORDER — INSULIN REGULAR(HUMAN) IN NACL 100-0.9 UT/100ML-% IV SOLN: INTRAVENOUS | Status: DC

## 2018-10-05 MED ORDER — INSULIN REGULAR(HUMAN) IN NACL 100-0.9 UT/100ML-% IV SOLN
INTRAVENOUS | Status: DC
  Administered 2018-10-05: 21:00:00 2.6 [IU]/h via INTRAVENOUS
  Filled 2018-10-05: qty 100

## 2018-10-05 MED ORDER — VANCOMYCIN HCL 10 G IV SOLR
2000.0000 mg | Freq: Once | INTRAVENOUS | Status: AC
  Administered 2018-10-05: 2000 mg via INTRAVENOUS
  Filled 2018-10-05: qty 2000

## 2018-10-05 NOTE — ED Provider Notes (Addendum)
[facility] Provider Note   CSN: [id number] Arrival date & time: 10/05/18  1646     History   Chief Complaint Chief Complaint  Patient presents with  . Chest Pain    HPI Lauren Cummings is a 58 y.o. female.     HPI 58 year old female history of MI, hypertension, type 2 diabetes, schizophrenia presents today complaining of chest pain.  Patient reports being coded positive here on June 4.  She states that she has been better since then although she has not been retested.  States that she has had substernal chest pain for the past 2 days.  Is waxed and waned.  She states it feels like it is sore and that she has pressure like somebody sitting on her.  Today she was at work when the pain worsened to 9 out of 10.  She became very lightheaded and diaphoretic.  She called her daughter to come get her although she lives across the street.  She states that she not feels like she can make at home.  During the past 2 days the pain is worsened when she has laid on her left side her right side.  It has eased when she is laying on her back.  She is unable to tell me any other things that make it better or worse.  She has had an MI in the past, but states that she did not know when she had her heart attack.  She was told after the fact.  She reports taking her medications as prescribed.  Denies smoking or drinking alcohol.  Pain is currently 7 out of 10.  Patient currently denies any cough, fever, nausea, vomiting, abdominal pain, diarrhea, lateralized leg swelling, or history of PE Past Medical History:  Diagnosis Date  . Diabetes mellitus without complication (HCC)   . Hypertension   . MI (myocardial infarction) (HCC)   . Schizophrenia [facility](HCC)     Patient Active Problem List   Diagnosis Date Noted  . Diabetes mellitus type 2, uncontrolled, without complications (HCC) 09/06/2018  . Essential hypertension 09/06/2018  . Schizophrenia (HCC) 09/06/2018  . Bipolar  disorder (HCC) 09/06/2018  . Pneumonia due to COVID-19 virus 08/29/2018  . Hyperglycemia 08/29/2018    Past Surgical History:  Procedure Laterality Date  . ABDOMINAL HYSTERECTOMY    . APPENDECTOMY    . hemoorhoid       OB History   No obstetric history on file.      Home Medications    Prior to Admission medications   Medication Sig Start Date End Date Taking? Authorizing Provider  amLODipine (NORVASC) 10 MG tablet Take 1 tablet (10 mg total) by mouth daily. 09/09/18   Marcine MatarJohnson, Deborah B, MD  ARIPiprazole (ABILIFY) 5 MG tablet Take 1 tablet (5 mg total) by mouth daily. 09/09/18   Marcine MatarJohnson, Deborah B, MD  aspirin EC 81 MG tablet Take 81 mg by mouth daily.    [provider]  diazepam (VALIUM) 5 MG tablet Take 5 mg by mouth every 12 (twelve) hours as needed for anxiety.    [provider]  doxepin (SINEQUAN) 25 MG capsule Take 1 capsule (25 mg total) by mouth at bedtime. 09/09/18   Marcine MatarJohnson, Deborah B, MD  Ertugliflozin L-PyroglutamicAc (STEGLATRO) 5 MG TABS Take 5 mg by mouth daily. 09/09/18   Marcine MatarJohnson, Deborah B, MD  Fluticasone-Salmeterol (ADVAIR) 250-50 MCG/DOSE AEPB Inhale 1 puff into the lungs 2 (two) times daily.    [provider]  hydrOXYzine (ATARAX/VISTARIL) 50 MG tablet Take 2 tablets (100 mg total) by mouth at bedtime. 09/09/18   Marcine MatarJohnson, Deborah B, MD  insulin glargine (LANTUS) 100 UNIT/ML injection Inject 0.2 mLs (20 Units total) into the skin 2 (two) times daily. 09/09/18   Marcine MatarJohnson, Deborah B, MD  insulin lispro (ADMELOG) 100 UNIT/ML injection Inject 12 Units into the skin 3 (three) times daily with meals.    [provider]  metFORMIN (GLUCOPHAGE) 1000 MG tablet Take 1 tablet (1,000 mg total) by mouth 2 (two) times daily with a meal. 09/09/18   Marcine MatarJohnson, Deborah B, MD  oxcarbazepine (TRILEPTAL) 600 MG tablet Take 2 tablets (1,200 mg total) by mouth at bedtime. 09/09/18   Marcine MatarJohnson, Deborah B, MD  sertraline (ZOLOFT) 100 MG tablet Take 1 tablet  (100 mg total) by mouth daily. 09/09/18   Marcine MatarJohnson, Deborah B, MD  ziprasidone (GEODON) 60 MG capsule Take 1 capsule (60 mg total) by mouth 2 (two) times daily with a meal. 09/09/18   Marcine MatarJohnson, Deborah B, MD    Family History Family History  Problem Relation Age of Onset  . Diabetes Mellitus II Mother   . Diabetes Mellitus II Father   . Diabetes Mellitus II Brother   . CAD Brother     Social History Social History   Tobacco Use  . Smoking status: Never Smoker  . Smokeless tobacco: Never Used  Substance Use Topics  . Alcohol use: Never    Frequency: Never  . Drug use: Not on file     Allergies   Keflex [cephalexin] and Penicillins   Review of Systems Review of Systems  All other systems reviewed and are negative.    Physical Exam Updated Vital Signs BP (!) 80/58   Pulse (!) 101   Temp 98.8 F (37.1 C) (Oral)   Resp 16   Ht 1.765 m (5' 9.5")   Wt 108.9 kg   BMI 34.93 kg/m   Physical Exam Vitals signs and nursing note reviewed.  Constitutional:      Appearance: She is well-developed. She is obese. She is ill-appearing and diaphoretic.  HENT:     Head: Normocephalic.  Eyes:     Pupils: Pupils are equal, round, and reactive to light.  Neck:     Musculoskeletal: Normal range of motion and neck supple.  Cardiovascular:     Rate and Rhythm: Normal rate and regular rhythm.     Pulses:          Radial pulses are 1+ on the right side and 1+ on the left side.       Dorsalis pedis pulses are 1+ on the right side and 1+ on the left side.     Heart sounds: Normal heart sounds.  Pulmonary:     Effort: Pulmonary effort is normal.     Breath sounds: Normal breath sounds.  Abdominal:     General: Bowel sounds are normal.     Palpations: Abdomen is soft.  Musculoskeletal: Normal range of motion.     Right lower leg: She exhibits no tenderness. No edema.     Left lower leg: She exhibits no tenderness. No edema.  Skin:    Capillary Refill: Capillary refill takes less  than 2 seconds.     Comments: Patient is diaphoretic  Neurological:     General: No focal deficit present.     Mental Status: She is alert.  Psychiatric:        Mood and Affect: Mood normal.  Behavior: Behavior normal.      ED Treatments / Results  Labs (all labs ordered are listed, but only abnormal results are displayed) Labs Reviewed  SARS CORONAVIRUS 2 (HOSPITAL ORDER, [place] [facility])  CBC  COMPREHENSIVE METABOLIC PANEL  TROPONIN I (HIGH SENSITIVITY)    EKG ED ECG REPORT   Date: 10/05/2018  Rate: 101  Rhythm: normal sinus rhythm  QRS Axis: normal  Intervals: normal  ST/T Wave abnormalities: nonspecific ST/T changes  Conduction Disutrbances:none, poor r wave progression  Narrative Interpretation:   Old EKG Reviewed: none available  I have personally reviewed the EKG tracing and agree with the computerized printout as noted.  Unable to read in [facility] Chest [facility] 1 View  Result Date: 10/05/2018 CLINICAL DATA:  Chest pain for 2 days EXAM: PORTABLE CHEST 1 VIEW COMPARISON:  08/29/2018 FINDINGS: Cardiac shadow is stable. Atelectatic changes in the right base are again identified. Patient is rotated to the right somewhat accentuating the mediastinal markings. Previously seen patchy opacities have predominately resolved in the interval from the prior exam. IMPRESSION: Improved aeration in the lungs bilaterally. Some persistent right basilar atelectasis is noted. Electronically Signed   By: Inez Catalina M.D.   On: 10/05/2018 18:43    Procedures .Critical Care Performed by: Pattricia Boss, MD Authorized by: Pattricia Boss, MD   Critical care provider statement:    Critical care time (minutes):  60   Critical care was necessary to treat or prevent imminent or life-threatening deterioration of the following conditions:  Cardiac failure, circulatory failure and shock   Critical care was time spent personally by me on the following  activities:  Discussions with consultants, evaluation of patient's response to treatment, examination of patient, ordering and performing treatments and interventions, ordering and review of laboratory studies, ordering and review of radiographic studies, pulse oximetry, re-evaluation of patient's condition, obtaining history from patient or surrogate and review of old charts   (including critical care time)  Medications Ordered in ED Medications  sodium chloride 0.9 % bolus 1,000 mL (has no administration in time range)     Initial Impression / Assessment and Plan / ED Course  I have reviewed the triage vital signs and the nursing notes.  Pertinent labs & imaging results that were available during my care of the patient were reviewed by me and considered in my medical decision making (see chart for details).      58 yo female with 2 days of chest pain now worsening presents with chest pain, diaphoresis, hypotension.  BP improved with iv fluis.  EKG nondiagnostic.  Initial troponin normal.  Hyperglycemia with anion gap 17. Leukocytosis with wbc 22,000 Repeat covid test pending CT angiogram and ct abdomen pending. Plan repeat troponin Review ct results Admit for further evaluation and treatment  7:16 PM Repeat exam.  Patient appears improved.   Vitals:   10/05/18 1752 10/05/18 1816  BP:  112/79  Pulse:  98  Resp:  (!) 24  Temp: 99.6 F (37.6 C)   SpO2:  96%  Abdomen soft and nttp Covid remains positive.  1-chest pain-cad,vs dissection vs pe vs covid as etiology  Patient with ct angio revealing rul pneumonia but no pe.  Patient treated with iv abx for CAP (allergy to ceftriaxone ) treated with levaquin .  Lactate added 2 hyperglycemia with some anion gap- iv fluids given and insulin drip started 3- initial hypotension- now improved with iv fluids  Discussed with Dr. Maudie Mercury and will  see for admission  Final Clinical Impressions(s) / ED Diagnoses   Final diagnoses:  COVID-19  virus detected  Pneumonia of right upper lobe due to infectious organism [facility](HCC)    ED Discharge Orders    None       Margarita Grizzleay, Halsey Persaud, MD 10/05/18 Kristopher [id number]    Margarita Grizzleay, Shah Insley, MD 10/05/18 1940

## 2018-10-05 NOTE — ED Notes (Signed)
Attempted to call report x 1  

## 2018-10-05 NOTE — Progress Notes (Addendum)
Pharmacy Antibiotic Note  Lauren Cummings is a 58 y.o. female admitted on 10/05/2018 with chest pain, diaphoresis and hypotension. Planning to start empiric antibiotics for pneumonia. Patient's Covid-19 results just returned, she is positive. SCr 1.3. LFTs minimally elevated.   Plan: -Aztreonam 2 g IV q8h -Vancomycin 2 g IV x1 then 750 mg IV q12h -Monitor renal fx, cultures, vancomycin levels as needed -Would consider discontinuing antibiotics given Covid +   Height: 5' 9.5" (176.5 cm) Weight: 240 lb (108.9 kg) IBW/kg (Calculated) : 67.35  Temp (24hrs), Avg:99.2 F (37.3 C), Min:98.8 F (37.1 C), Max:99.6 F (37.6 C)  Recent Labs  Lab 10/05/18 1705  WBC 22.3*  CREATININE 1.31*    Estimated Creatinine Clearance: 63.6 mL/min (A) (by C-G formula based on SCr of 1.31 mg/dL (H)).    Allergies  Allergen Reactions  . Keflex [Cephalexin] Anaphylaxis  . Penicillins Anaphylaxis    Did it involve swelling of the face/tongue/throat, SOB, or low BP? yes Did it involve sudden or severe rash/hives, skin peeling, or any reaction on the inside of your mouth or nose? no Did you need to seek medical attention at a hospital or doctor's office? yes When did it last happen?8 years ago If all above answers are "NO", may proceed with cephalosporin use.    Antimicrobials this admission: 7/11 cefepime > 7/11 vancomycin >  Dose adjustments this admission: N/A  Microbiology results: 7/11 resp cx: 7/11 blood cx: 7/11 Covid-19: +  Harvel Quale 10/05/2018 8:03 PM

## 2018-10-05 NOTE — ED Notes (Signed)
hospitalist at bedside

## 2018-10-05 NOTE — H&P (Signed)
TRH H&P    Patient Demographics:    Lauren Cummings, is a 58 y.o. female  MRN: [id number]  DOB - [birth year]  Admit Date - 10/05/2018  Referring MD/NP/PA:   Pryor Curia  Outpatient Primary MD for the patient is Patient, No Pcp Per  Patient coming from:  home  Chief complaint-     HPI:    Lauren Cummings  is a 58 y.o. female,w Bipolar/ Schizophrenia, hypertension, ?CAD h/o MI (supposedly in [place]), covid 19 + (08/29/2018) , apparently c/o chest pain w radiation down left arm. Pt denies fever, chills, cough, palp, sob, n/v, abd pain, diarrhea, brbpr, black stool.     In ED,  T 98.8, P 101, R 16, Bp 80/58  Pox 96%  Wbc 22.3, Hgb 13.4, Plt 328 Na 131, K 3.6  Bun 11, Creatinine 1.31 Glucose 339   Hco3 20  , AG 17 Alb 2.9 Ast 80, Alt 71, Alk phos 196, T. Bili 4.1 Trop 13  EKG not available ?  covid 19 +  CTA chest, abd / pelvis   IMPRESSION: No evidence of pulmonary embolism.  Anterior right upper lobe pneumonia. While this may reflect bronchopneumonia, given the patient's clinical history, correlate for pending COVID test results.  Unremarkable CT abdomen/pelvis.  Pt started on iv insulin in ED,   Pt will be admitted for chest pain and anterior right upper lung pneumonia (Hcap).      Review of systems:    In addition to the HPI above,  No Fever-chills, No Headache, No changes with Vision or hearing, No problems swallowing food or Liquids, No Cough or Shortness of Breath, No Abdominal pain, No Nausea or Vomiting, bowel movements are regular, No Blood in stool or Urine, No dysuria, No new skin rashes or bruises, No new joints pains-aches,  No new weakness, tingling, numbness in any extremity, No recent weight gain or loss, No polyuria, polydypsia or polyphagia, No significant Mental Stressors.  All other systems reviewed and are negative.    Past History of the following :    Past  Medical History:  Diagnosis Date   Diabetes mellitus without complication ([place])    Hypertension    MI (myocardial infarction) ([place])    Schizophrenia ([place])       Past Surgical History:  Procedure Laterality Date   ABDOMINAL HYSTERECTOMY     APPENDECTOMY     hemoorhoid        Social History:      Social History   Tobacco Use   Smoking status: Never Smoker   Smokeless tobacco: Never Used  Substance Use Topics   Alcohol use: Never    Frequency: Never       Family History :     Family History  Problem Relation Age of Onset   Diabetes Mellitus II Mother    Diabetes Mellitus II Father    Diabetes Mellitus II Brother    CAD Brother        Home Medications:   Prior to Admission medications   Medication Sig  Start Date End Date Taking? Authorizing Provider  amLODipine (NORVASC) 10 MG tablet Take 1 tablet (10 mg total) by mouth daily. 09/09/18   Ladell Pier, MD  ARIPiprazole (ABILIFY) 5 MG tablet Take 1 tablet (5 mg total) by mouth daily. 09/09/18   Ladell Pier, MD  aspirin EC 81 MG tablet Take 81 mg by mouth daily.    [provider]  diazepam (VALIUM) 5 MG tablet Take 5 mg by mouth every 12 (twelve) hours as needed for anxiety.    [provider]  doxepin (SINEQUAN) 25 MG capsule Take 1 capsule (25 mg total) by mouth at bedtime. 09/09/18   Ladell Pier, MD  Ertugliflozin L-PyroglutamicAc (STEGLATRO) 5 MG TABS Take 5 mg by mouth daily. 09/09/18   Ladell Pier, MD  Fluticasone-Salmeterol (ADVAIR) 250-50 MCG/DOSE AEPB Inhale 1 puff into the lungs 2 (two) times daily.    [provider]  hydrOXYzine (ATARAX/VISTARIL) 50 MG tablet Take 2 tablets (100 mg total) by mouth at bedtime. 09/09/18   Ladell Pier, MD  insulin glargine (LANTUS) 100 UNIT/ML injection Inject 0.2 mLs (20 Units total) into the skin 2 (two) times daily. 09/09/18   Ladell Pier, MD  insulin lispro (ADMELOG) 100 UNIT/ML injection Inject  12 Units into the skin 3 (three) times daily with meals.    [provider]  metFORMIN (GLUCOPHAGE) 1000 MG tablet Take 1 tablet (1,000 mg total) by mouth 2 (two) times daily with a meal. 09/09/18   Ladell Pier, MD  oxcarbazepine (TRILEPTAL) 600 MG tablet Take 2 tablets (1,200 mg total) by mouth at bedtime. 09/09/18   Ladell Pier, MD  sertraline (ZOLOFT) 100 MG tablet Take 1 tablet (100 mg total) by mouth daily. 09/09/18   Ladell Pier, MD  ziprasidone (GEODON) 60 MG capsule Take 1 capsule (60 mg total) by mouth 2 (two) times daily with a meal. 09/09/18   Ladell Pier, MD     Allergies:     Allergies  Allergen Reactions   Keflex [Cephalexin] Anaphylaxis   Penicillins Anaphylaxis    Did it involve swelling of the face/tongue/throat, SOB, or low BP? yes Did it involve sudden or severe rash/hives, skin peeling, or any reaction on the inside of your mouth or nose? no Did you need to seek medical attention at a hospital or doctor's office? yes When did it last happen?8 years ago If all above answers are NO, may proceed with cephalosporin use.     Physical Exam:   Vitals  Blood pressure 112/79, pulse 98, temperature 99.6 F (37.6 C), temperature source Rectal, resp. rate (!) 24, height 5' 9.5" (1.765 m), weight 108.9 kg, SpO2 96 %.  1.  General: aoxo3  2. Psychiatric: euthymic  3. Neurologic: cn2-12 intact, reflexes 2+ symmetric, diffuse with no clonus, motor 5/5 in all 4 ext  4. HEENMT:  Icteric, pupils 1.39m symmetric, direct, consensual, near intact Neck: no jvd, no bruit  5. Respiratory : Slight crackles right upper lung, no wheezing  6. Cardiovascular : rrr s1, s2, no m/g/r  7. Gastrointestinal:  Abd: soft, nt, nd, +bs  8. Skin:  Ext: no c/c/e,  No rash  9.Musculoskeletal:  Good ROM,   No adenopathy    Data Review:    CBC Recent Labs  Lab 10/05/18 1705  WBC 22.3*  HGB 13.4  HCT 39.1  PLT 328  MCV 80.0  MCH  27.4  MCHC 34.3  RDW 13.2   ------------------------------------------------------------------------------------------------------------------  Results for orders placed or performed during the hospital encounter of 10/05/18 (from the past 48 hour(s))  CBC     Status: Abnormal   Collection Time: 10/05/18  5:05 PM  Result Value Ref Range   WBC 22.3 (H) 4.0 - 10.5 K/uL   RBC 4.89 3.87 - 5.11 MIL/uL   Hemoglobin 13.4 12.0 - 15.0 g/dL   HCT 39.1 36.0 - 46.0 %   MCV 80.0 80.0 - 100.0 fL   MCH 27.4 26.0 - 34.0 pg   MCHC 34.3 30.0 - 36.0 g/dL   RDW 13.2 11.5 - 15.5 %   Platelets 328 150 - 400 K/uL   nRBC 0.0 0.0 - 0.2 %    Comment: Performed at [facility], [place] [address], [place], [place] [address]  Comprehensive metabolic panel     Status: Abnormal   Collection Time: 10/05/18  5:05 PM  Result Value Ref Range   Sodium 131 (L) 135 - 145 mmol/L   Potassium 3.6 3.5 - 5.1 mmol/L   Chloride 94 (L) 98 - 111 mmol/L   CO2 20 (L) 22 - 32 mmol/L   Glucose, Bld 339 (H) 70 - 99 mg/dL   BUN 11 6 - 20 mg/dL   Creatinine, Ser 1.31 (H) 0.44 - 1.00 mg/dL   Calcium 9.1 8.9 - 10.3 mg/dL   Total Protein 7.7 6.5 - 8.1 g/dL   Albumin 2.9 (L) 3.5 - 5.0 g/dL   AST 80 (H) 15 - 41 U/L   ALT 71 (H) 0 - 44 U/L   Alkaline Phosphatase 196 (H) 38 - 126 U/L   Total Bilirubin 4.1 (H) 0.3 - 1.2 mg/dL   GFR calc non Af Amer 45 (L) >60 mL/min   GFR calc Af Amer 53 (L) >60 mL/min   Anion gap 17 (H) 5 - 15    Comment: Performed at [facility], [place] [address] [place], [place] [address]  Troponin I (High Sensitivity)     Status: None   Collection Time: 10/05/18  5:05 PM  Result Value Ref Range   Troponin I (High Sensitivity) 13 <18 ng/L    Comment: (NOTE) Elevated high sensitivity troponin I (hsTnI) values and significant  changes across serial measurements may suggest ACS but many other  chronic and acute conditions are known to elevate hsTnI results.  Refer to the "Links" section for chest pain  algorithms and additional  guidance. Performed at [facility], [place] [address] [place], [place] [address]   SARS Coronavirus 2 (CEPHEID - Performed in [place] hospital lab), Hosp Order     Status: Abnormal   Collection Time: 10/05/18  5:40 PM   Specimen: Nasopharyngeal Swab  Result Value Ref Range   SARS Coronavirus 2 POSITIVE (A) NEGATIVE    Comment: RESULT CALLED TO, READ BACK BY AND VERIFIED WITH: RN Teofilo Pod [phone] MLM (NOTE) If result is NEGATIVE SARS-CoV-2 target nucleic acids are NOT DETECTED. The SARS-CoV-2 RNA is generally detectable in upper and lower  respiratory specimens during the acute phase of infection. The lowest  concentration of SARS-CoV-2 viral copies this assay can detect is 250  copies / mL. A negative result does not preclude SARS-CoV-2 infection  and should not be used as the sole basis for treatment or other  patient management decisions.  A negative result may occur with  improper specimen collection / handling, submission of specimen other  than nasopharyngeal swab, presence of viral mutation(s) within the  areas  targeted by this assay, and inadequate number of viral copies  (<250 copies / mL). A negative result must be combined with clinical  observations, patient history, and epidemiological information. If result is POSITIVE SARS-CoV-2 target nucleic acids are DETECTED. The SARS -CoV-2 RNA is generally detectable in upper and lower  respiratory specimens during the acute phase of infection.  Positive  results are indicative of active infection with SARS-CoV-2.  Clinical  correlation with patient history and other diagnostic information is  necessary to determine patient infection status.  Positive results do  not rule out bacterial infection or co-infection with other viruses. If result is PRESUMPTIVE POSTIVE SARS-CoV-2 nucleic acids MAY BE PRESENT.   A presumptive positive result was obtained on the submitted specimen  and  confirmed on repeat testing.  While 2019 novel coronavirus  (SARS-CoV-2) nucleic acids may be present in the submitted sample  additional confirmatory testing may be necessary for epidemiological  and / or clinical management purposes  to differentiate between  SARS-CoV-2 and other Sarbecovirus currently known to infect humans.  If clinically indicated additional testing with an alternate test  methodology [phone]) is advise d. The SARS-CoV-2 RNA is generally  detectable in upper and lower respiratory specimens during the acute  phase of infection. The expected result is Negative. Fact Sheet for Patients:  StrictlyIdeas.no Fact Sheet for Healthcare Providers: BankingDealers.co.za This test is not yet approved or cleared by the [place] FDA and has been authorized for detection and/or diagnosis of SARS-CoV-2 by FDA under an Emergency Use Authorization (EUA).  This EUA will remain in effect (meaning this test can be used) for the duration of the COVID-19 declaration under Section 564(b)(1) of the Act, 21 U.S.C. section 360bbb-3(b)(1), unless the authorization is terminated or revoked sooner. Performed at [facility], [place] [address], [place], [place] [address]   CBG monitoring, ED     Status: Abnormal   Collection Time: 10/05/18  5:41 PM  Result Value Ref Range   Glucose-Capillary 312 (H) 70 - 99 mg/dL    Chemistries  Recent Labs  Lab 10/05/18 1705  NA 131*  K 3.6  CL 94*  CO2 20*  GLUCOSE 339*  BUN 11  CREATININE 1.31*  CALCIUM 9.1  AST 80*  ALT 71*  ALKPHOS 196*  BILITOT 4.1*   ------------------------------------------------------------------------------------------------------------------  ------------------------------------------------------------------------------------------------------------------ GFR: Estimated Creatinine Clearance: 63.6 mL/min (A) (by C-G formula based on SCr of 1.31 mg/dL  (H)). Liver Function Tests: Recent Labs  Lab 10/05/18 1705  AST 80*  ALT 71*  ALKPHOS 196*  BILITOT 4.1*  PROT 7.7  ALBUMIN 2.9*   No results for input(s): LIPASE, AMYLASE in the last 168 hours. No results for input(s): AMMONIA in the last 168 hours. Coagulation Profile: No results for input(s): INR, PROTIME in the last 168 hours. Cardiac Enzymes: No results for input(s): CKTOTAL, CKMB, CKMBINDEX, TROPONINI in the last 168 hours. BNP (last 3 results) No results for input(s): PROBNP in the last 8760 hours. HbA1C: No results for input(s): HGBA1C in the last 72 hours. CBG: Recent Labs  Lab 10/05/18 1741  GLUCAP 312*   Lipid Profile: No results for input(s): CHOL, HDL, LDLCALC, TRIG, CHOLHDL, LDLDIRECT in the last 72 hours. Thyroid Function Tests: No results for input(s): TSH, T4TOTAL, FREET4, T3FREE, THYROIDAB in the last 72 hours. Anemia Panel: No results for input(s): VITAMINB12, FOLATE, FERRITIN, TIBC, IRON, RETICCTPCT in the last 72 hours.  --------------------------------------------------------------------------------------------------------------- Urine analysis: No results found for: COLORURINE, APPEARANCEUR, [place], [place], [place], [place], [place],  [place]errill Memo, UROBILINOGEN, NITRITE, LEUKOCYTESUR    Imaging Results:    Ct Angio Chest Pe W And/or Wo Contrast  Result Date: 10/05/2018 CLINICAL DATA:  Chest pain, diaphoresis, hypotension, tachycardia. COVID positive 1 month ago, now cleared to return to work. EXAM: CT ANGIOGRAPHY CHEST CT ABDOMEN AND PELVIS WITH CONTRAST TECHNIQUE: Multidetector CT imaging of the chest was performed using the standard protocol during bolus administration of intravenous contrast. Multiplanar CT image reconstructions and MIPs were obtained to evaluate the vascular anatomy. Multidetector CT imaging of the abdomen and pelvis was performed using the standard protocol during bolus administration of intravenous contrast.  CONTRAST:  189m OMNIPAQUE IOHEXOL 350 MG/ML SOLN COMPARISON:  None. FINDINGS: CTA CHEST FINDINGS Cardiovascular: Preferential opacification of the bilateral pulmonary arteries to the segmental level. No evidence of pulmonary embolism. No evidence of thoracic aortic aneurysm. The heart is top-normal in size.  No pericardial effusion. Mediastinum/Nodes: 13 mm short axis right paratracheal node (series 5/image 43). 11 mm short axis subcarinal node (series 5/image 56). 2.0 cm short axis right hilar node (series 5/image 50). Sixteen mm short axis right infrahilar node (series 5/image 62). Visualized thyroid is unremarkable. Lungs/Pleura: Consolidation with air bronchograms in the anterior right upper lobe (series 6/image 52), compatible with pneumonia. Mild subpleural nodularity in the right upper and middle lobe measuring 3-5 mm (series 6/images 71 and 76), likely benign. Mild dependent atelectasis in the bilateral lower lobes. Mild platelike scarring/atelectasis in the right middle lobe and lingula. No pleural effusion or pneumothorax. Musculoskeletal: Mild degenerative changes of the lower thoracic spine. Review of the MIP images confirms the above findings. CT ABDOMEN and PELVIS FINDINGS Hepatobiliary: Liver is within normal limits. Gallbladder is unremarkable. No intrahepatic or extrahepatic ductal dilatation. Pancreas: Within normal limits. Spleen: Within normal limits. Adrenals/Urinary Tract: Adrenal glands are within normal limits. Mild right renal cortical scarring. 2.1 cm medial right upper pole renal cyst (series 17/image 11). Left kidney is within normal limits. No hydronephrosis. Bladder is underdistended but unremarkable. Stomach/Bowel: Stomach is within normal limits. No evidence of bowel obstruction. Prior appendectomy. Vascular/Lymphatic: No evidence of abdominal aortic aneurysm. No suspicious abdominopelvic lymphadenopathy. Reproductive: Status post hysterectomy. No adnexal masses. Other: No  abdominopelvic ascites. Musculoskeletal: Degenerative changes at L5-S1. Review of the MIP images confirms the above findings. IMPRESSION: No evidence of pulmonary embolism. Anterior right upper lobe pneumonia. While this may reflect bronchopneumonia, given the patient's clinical history, correlate for pending COVID test results. Unremarkable CT abdomen/pelvis. Electronically Signed   By: SJulian HyM.D.   On: 10/05/2018 19:18   Ct Abdomen Pelvis W Contrast  Result Date: 10/05/2018 CLINICAL DATA:  Chest pain, diaphoresis, hypotension, tachycardia. COVID positive 1 month ago, now cleared to return to work. EXAM: CT ANGIOGRAPHY CHEST CT ABDOMEN AND PELVIS WITH CONTRAST TECHNIQUE: Multidetector CT imaging of the chest was performed using the standard protocol during bolus administration of intravenous contrast. Multiplanar CT image reconstructions and MIPs were obtained to evaluate the vascular anatomy. Multidetector CT imaging of the abdomen and pelvis was performed using the standard protocol during bolus administration of intravenous contrast. CONTRAST:  1029mOMNIPAQUE IOHEXOL 350 MG/ML SOLN COMPARISON:  None. FINDINGS: CTA CHEST FINDINGS Cardiovascular: Preferential opacification of the bilateral pulmonary arteries to the segmental level. No evidence of pulmonary embolism. No evidence of thoracic aortic aneurysm. The heart is top-normal in size.  No pericardial effusion. Mediastinum/Nodes: 13 mm short axis right paratracheal node (series 5/image 43). 11 mm short axis subcarinal node (series 5/image 56). 2.0 cm short axis  right hilar node (series 5/image 50). Sixteen mm short axis right infrahilar node (series 5/image 62). Visualized thyroid is unremarkable. Lungs/Pleura: Consolidation with air bronchograms in the anterior right upper lobe (series 6/image 52), compatible with pneumonia. Mild subpleural nodularity in the right upper and middle lobe measuring 3-5 mm (series 6/images 71 and 76), likely  benign. Mild dependent atelectasis in the bilateral lower lobes. Mild platelike scarring/atelectasis in the right middle lobe and lingula. No pleural effusion or pneumothorax. Musculoskeletal: Mild degenerative changes of the lower thoracic spine. Review of the MIP images confirms the above findings. CT ABDOMEN and PELVIS FINDINGS Hepatobiliary: Liver is within normal limits. Gallbladder is unremarkable. No intrahepatic or extrahepatic ductal dilatation. Pancreas: Within normal limits. Spleen: Within normal limits. Adrenals/Urinary Tract: Adrenal glands are within normal limits. Mild right renal cortical scarring. 2.1 cm medial right upper pole renal cyst (series 17/image 11). Left kidney is within normal limits. No hydronephrosis. Bladder is underdistended but unremarkable. Stomach/Bowel: Stomach is within normal limits. No evidence of bowel obstruction. Prior appendectomy. Vascular/Lymphatic: No evidence of abdominal aortic aneurysm. No suspicious abdominopelvic lymphadenopathy. Reproductive: Status post hysterectomy. No adnexal masses. Other: No abdominopelvic ascites. Musculoskeletal: Degenerative changes at L5-S1. Review of the MIP images confirms the above findings. IMPRESSION: No evidence of pulmonary embolism. Anterior right upper lobe pneumonia. While this may reflect bronchopneumonia, given the patient's clinical history, correlate for pending COVID test results. Unremarkable CT abdomen/pelvis. Electronically Signed   By: Julian Hy M.D.   On: 10/05/2018 19:18   Dg Chest Port 1 View  Result Date: 10/05/2018 CLINICAL DATA:  Chest pain for 2 days EXAM: PORTABLE CHEST 1 VIEW COMPARISON:  08/29/2018 FINDINGS: Cardiac shadow is stable. Atelectatic changes in the right base are again identified. Patient is rotated to the right somewhat accentuating the mediastinal markings. Previously seen patchy opacities have predominately resolved in the interval from the prior exam. IMPRESSION: Improved aeration  in the lungs bilaterally. Some persistent right basilar atelectasis is noted. Electronically Signed   By: Inez Catalina M.D.   On: 10/05/2018 18:43       Assessment & Plan:    Principal Problem:   HCAP (healthcare-associated pneumonia) Active Problems:   Pneumonia due to COVID-19 virus   Diabetes mellitus type 2, uncontrolled, without complications ([place])   Essential hypertension   Bipolar disorder ([place])   Abnormal liver function   Chest pain   Hcap , Covid 19 + Blood culture x2 Sputum gram stain/ culture Urine legionella antigen, urine strep antigen vanco iv, aztreonam iv pharmacy to dose zithromax 520m iv qday  Hyperglycemia, uncontrolled DM2 Hold Stegalatro, Hold Metformin, Hold Lantus Check hga1c Check bmp at 2330, and cmp in am NPO except for medication Ns iv at 1546mper hour and when glucose <250, change to d5 ns at 5074mer hour til Hco3/ AG improved Iv insulin Note Patient almost qualifies for DKA criteria, (Hco3 20, AG 17), Steglatro could predispose to DKA, consider stopping on discharge  Chest pain Tele Trop I 13=> 2nd trop pending Check Lipid Check cardiac echo Cont aspirin No statin due to abnormal liver function  Abnormal liver function Check acute hepatitis panel Check cpk Check cmp in am If liver function not improving please discuss with psychiatry as to whether or not to continue Trileptal, Doxepin, Abilify, Geodon  Bipolar/Schizophrenia Cont Abilify 5mg16m qday Cont Geodon 60mg16mbid Cont Zoloft 100mg 49mday Cont Trileptal 1200mg p52may for now, see above Hold Doxepin Cont Valium  Cont Hydroxyzine  DVT Prophylaxis-   Lovenox - SCDs   AM Labs Ordered, also please review Full Orders  Family Communication: Admission, patients condition and plan of care including tests being ordered have been discussed with the patient  who indicate understanding and agree with the plan and Code Status.  Code Status:   FULL CODE,  Pt declines me to  talk to her family regarding her admission   Admission status: Inpatient: Based on patients clinical presentation and evaluation of above clinical data, I have made determination that patient meets Inpatient criteria at this time.  Pt has high risk of clinical deterioration,  Pt has Hcap and is covid -19 positive,  Pt will require iv abx, as well as monitoring of culture results and also close monitoring of abnormal liver function and possibly need to modify her psychiatric medication due to abnormal liver function.  Pt also needs iv hydration due to near DKA status.   Time spent in minutes :  70 minutes   Jani Gravel M.D on 10/05/2018 at 8:02 PM

## 2018-10-05 NOTE — ED Triage Notes (Signed)
Pt brought in by [facility] from a parking lot for CP, diaphoresis, hypotension, and tachycardia. Per EMS pt was COVID positive x1 month ago, but has since been cleared to go back to work. Pt endorses midsternal CP radiating down left arm. Pt diaphoretic on arrival. Pt endorses hx of MI, htn and dm.

## 2018-10-05 NOTE — ED Notes (Signed)
Attempted report 

## 2018-10-06 ENCOUNTER — Inpatient Hospital Stay (HOSPITAL_COMMUNITY): Payer: Medicaid - Out of State

## 2018-10-06 DIAGNOSIS — R072 Precordial pain: Secondary | ICD-10-CM

## 2018-10-06 DIAGNOSIS — F259 Schizoaffective disorder, unspecified: Secondary | ICD-10-CM

## 2018-10-06 DIAGNOSIS — I4891 Unspecified atrial fibrillation: Secondary | ICD-10-CM

## 2018-10-06 DIAGNOSIS — I480 Paroxysmal atrial fibrillation: Secondary | ICD-10-CM | POA: Diagnosis present

## 2018-10-06 DIAGNOSIS — J9621 Acute and chronic respiratory failure with hypoxia: Secondary | ICD-10-CM

## 2018-10-06 DIAGNOSIS — I10 Essential (primary) hypertension: Secondary | ICD-10-CM

## 2018-10-06 DIAGNOSIS — I509 Heart failure, unspecified: Secondary | ICD-10-CM

## 2018-10-06 DIAGNOSIS — J189 Pneumonia, unspecified organism: Secondary | ICD-10-CM

## 2018-10-06 LAB — BILIRUBIN, FRACTIONATED(TOT/DIR/INDIR)
Bilirubin, Direct: 1.7 mg/dL — ABNORMAL HIGH (ref 0.0–0.2)
Indirect Bilirubin: 1.2 mg/dL — ABNORMAL HIGH (ref 0.3–0.9)
Total Bilirubin: 2.9 mg/dL — ABNORMAL HIGH (ref 0.3–1.2)

## 2018-10-06 LAB — COMPREHENSIVE METABOLIC PANEL
ALT: 50 U/L — ABNORMAL HIGH (ref 0–44)
AST: 36 U/L (ref 15–41)
Albumin: 2.4 g/dL — ABNORMAL LOW (ref 3.5–5.0)
Alkaline Phosphatase: 169 U/L — ABNORMAL HIGH (ref 38–126)
Anion gap: 12 (ref 5–15)
BUN: 13 mg/dL (ref 6–20)
CO2: 22 mmol/L (ref 22–32)
Calcium: 8.9 mg/dL (ref 8.9–10.3)
Chloride: 101 mmol/L (ref 98–111)
Creatinine, Ser: 0.82 mg/dL (ref 0.44–1.00)
GFR calc Af Amer: >60 mL/min (ref >60)
GFR calc non Af Amer: >60 mL/min (ref >60)
Glucose, Bld: 158 mg/dL — ABNORMAL HIGH (ref 70–99)
Potassium: 3.6 mmol/L (ref 3.5–5.1)
Sodium: 135 mmol/L (ref 135–145)
Total Bilirubin: 3.2 mg/dL — ABNORMAL HIGH (ref 0.3–1.2)
Total Protein: 7 g/dL (ref 6.5–8.1)

## 2018-10-06 LAB — T4, FREE: Free T4: 1.47 ng/dL — ABNORMAL HIGH (ref 0.61–1.12)

## 2018-10-06 LAB — GLUCOSE, CAPILLARY
Glucose-Capillary: 199 mg/dL — ABNORMAL HIGH (ref 70–99)
Glucose-Capillary: 175 mg/dL — ABNORMAL HIGH (ref 70–99)
Glucose-Capillary: 134 mg/dL — ABNORMAL HIGH (ref 70–99)
Glucose-Capillary: 133 mg/dL — ABNORMAL HIGH (ref 70–99)
Glucose-Capillary: 127 mg/dL — ABNORMAL HIGH (ref 70–99)
Glucose-Capillary: 171 mg/dL — ABNORMAL HIGH (ref 70–99)
Glucose-Capillary: 188 mg/dL — ABNORMAL HIGH (ref 70–99)
Glucose-Capillary: 201 mg/dL — ABNORMAL HIGH (ref 70–99)
Glucose-Capillary: 221 mg/dL — ABNORMAL HIGH (ref 70–99)
Glucose-Capillary: 189 mg/dL — ABNORMAL HIGH (ref 70–99)
Glucose-Capillary: 278 mg/dL — ABNORMAL HIGH (ref 70–99)
Glucose-Capillary: 399 mg/dL — ABNORMAL HIGH (ref 70–99)

## 2018-10-06 LAB — BASIC METABOLIC PANEL
Anion gap: 9 (ref 5–15)
Anion gap: 14 (ref 5–15)
BUN: 13 mg/dL (ref 6–20)
BUN: 21 mg/dL — ABNORMAL HIGH (ref 6–20)
CO2: 23 mmol/L (ref 22–32)
CO2: 17 mmol/L — ABNORMAL LOW (ref 22–32)
Calcium: 8.5 mg/dL — ABNORMAL LOW (ref 8.9–10.3)
Calcium: 9.1 mg/dL (ref 8.9–10.3)
Chloride: 101 mmol/L (ref 98–111)
Chloride: 102 mmol/L (ref 98–111)
Creatinine, Ser: 0.83 mg/dL (ref 0.44–1.00)
Creatinine, Ser: 0.87 mg/dL (ref 0.44–1.00)
GFR calc Af Amer: >60 mL/min (ref >60)
GFR calc Af Amer: >60 mL/min (ref >60)
GFR calc non Af Amer: >60 mL/min (ref >60)
GFR calc non Af Amer: >60 mL/min (ref >60)
Glucose, Bld: 252 mg/dL — ABNORMAL HIGH (ref 70–99)
Glucose, Bld: 347 mg/dL — ABNORMAL HIGH (ref 70–99)
Potassium: 3.6 mmol/L (ref 3.5–5.1)
Potassium: 4.1 mmol/L (ref 3.5–5.1)
Sodium: 133 mmol/L — ABNORMAL LOW (ref 135–145)
Sodium: 133 mmol/L — ABNORMAL LOW (ref 135–145)

## 2018-10-06 LAB — CBC WITH DIFFERENTIAL/PLATELET
Abs Immature Granulocytes: 0.42 10*3/uL — ABNORMAL HIGH (ref 0.00–0.07)
Basophils Absolute: 0.1 10*3/uL (ref 0.0–0.1)
Basophils Relative: 0 %
Eosinophils Absolute: 0.1 10*3/uL (ref 0.0–0.5)
Eosinophils Relative: 0 %
HCT: 36.6 % (ref 36.0–46.0)
Hemoglobin: 12.4 g/dL (ref 12.0–15.0)
Immature Granulocytes: 2 %
Lymphocytes Relative: 3 %
Lymphs Abs: 0.6 10*3/uL — ABNORMAL LOW (ref 0.7–4.0)
MCH: 27.2 pg (ref 26.0–34.0)
MCHC: 33.9 g/dL (ref 30.0–36.0)
MCV: 80.3 fL (ref 80.0–100.0)
Monocytes Absolute: 0.4 10*3/uL (ref 0.1–1.0)
Monocytes Relative: 2 %
Neutro Abs: 17 10*3/uL — ABNORMAL HIGH (ref 1.7–7.7)
Neutrophils Relative %: 93 %
Platelets: 318 10*3/uL (ref 150–400)
RBC: 4.56 MIL/uL (ref 3.87–5.11)
RDW: 13.4 % (ref 11.5–15.5)
WBC: 18.6 10*3/uL — ABNORMAL HIGH (ref 4.0–10.5)
nRBC: 0 % (ref 0.0–0.2)

## 2018-10-06 LAB — LACTIC ACID, PLASMA: Lactic Acid, Venous: 2.9 mmol/L (ref 0.5–1.9)

## 2018-10-06 LAB — BRAIN NATRIURETIC PEPTIDE: B Natriuretic Peptide: 1575.5 pg/mL — ABNORMAL HIGH (ref 0.0–100.0)

## 2018-10-06 LAB — TSH: TSH: 1.536 u[IU]/mL (ref 0.350–4.500)

## 2018-10-06 LAB — TROPONIN I (HIGH SENSITIVITY)
Troponin I (High Sensitivity): 36 ng/L — ABNORMAL HIGH (ref <18)
Troponin I (High Sensitivity): 37 ng/L — ABNORMAL HIGH (ref <18)

## 2018-10-06 LAB — STREP PNEUMONIAE URINARY ANTIGEN: Strep Pneumo Urinary Antigen: NEGATIVE

## 2018-10-06 LAB — HIV ANTIBODY (ROUTINE TESTING W REFLEX): HIV Screen 4th Generation wRfx: NONREACTIVE

## 2018-10-06 LAB — HEMOGLOBIN A1C
Hgb A1c MFr Bld: 7.8 % — ABNORMAL HIGH (ref 4.8–5.6)
Mean Plasma Glucose: 177.16 mg/dL

## 2018-10-06 LAB — CK: Total CK: 44 U/L (ref 38–234)

## 2018-10-06 LAB — CBG MONITORING, ED: Glucose-Capillary: 222 mg/dL — ABNORMAL HIGH (ref 70–99)

## 2018-10-06 LAB — MAGNESIUM: Magnesium: 1.8 mg/dL (ref 1.7–2.4)

## 2018-10-06 MED ORDER — DILTIAZEM LOAD VIA INFUSION
15.0000 mg | Freq: Once | INTRAVENOUS | Status: AC
  Administered 2018-10-06: 15 mg via INTRAVENOUS
  Filled 2018-10-06: qty 15

## 2018-10-06 MED ORDER — ORAL CARE MOUTH RINSE
15.0000 mL | Freq: Two times a day (BID) | OROMUCOSAL | Status: DC
  Administered 2018-10-07 – 2018-10-12 (×9): 15 mL via OROMUCOSAL

## 2018-10-06 MED ORDER — METHYLPREDNISOLONE SODIUM SUCC 125 MG IJ SOLR
125.0000 mg | Freq: Once | INTRAMUSCULAR | Status: AC
  Administered 2018-10-06: 125 mg via INTRAVENOUS
  Filled 2018-10-06: qty 2

## 2018-10-06 MED ORDER — INSULIN ASPART 100 UNIT/ML ~~LOC~~ SOLN: 10.0000 [IU] | Freq: Once | SUBCUTANEOUS | Status: DC

## 2018-10-06 MED ORDER — ALBUTEROL SULFATE HFA 108 (90 BASE) MCG/ACT IN AERS
2.0000 | INHALATION_SPRAY | RESPIRATORY_TRACT | Status: DC | PRN
  Filled 2018-10-06 (×2): qty 6.7

## 2018-10-06 MED ORDER — METHYLPREDNISOLONE SODIUM SUCC 125 MG IJ SOLR: 60.0000 mg | Freq: Two times a day (BID) | INTRAMUSCULAR | Status: DC

## 2018-10-06 MED ORDER — ENOXAPARIN SODIUM 100 MG/ML ~~LOC~~ SOLN
90.0000 mg | Freq: Two times a day (BID) | SUBCUTANEOUS | Status: DC
  Administered 2018-10-06 – 2018-10-07 (×2): 90 mg via SUBCUTANEOUS
  Filled 2018-10-06 (×2): qty 1

## 2018-10-06 MED ORDER — INSULIN ASPART 100 UNIT/ML ~~LOC~~ SOLN
0.0000 [IU] | Freq: Every day | SUBCUTANEOUS | Status: DC
  Administered 2018-10-06: 5 [IU] via SUBCUTANEOUS
  Administered 2018-10-08: 4 [IU] via SUBCUTANEOUS
  Administered 2018-10-09 – 2018-10-10 (×2): 2 [IU] via SUBCUTANEOUS
  Administered 2018-10-11: 3 [IU] via SUBCUTANEOUS

## 2018-10-06 MED ORDER — INSULIN ASPART 100 UNIT/ML ~~LOC~~ SOLN
10.0000 [IU] | Freq: Once | SUBCUTANEOUS | Status: AC
  Administered 2018-10-06: 10 [IU] via SUBCUTANEOUS

## 2018-10-06 MED ORDER — INSULIN ASPART 100 UNIT/ML ~~LOC~~ SOLN
0.0000 [IU] | Freq: Three times a day (TID) | SUBCUTANEOUS | Status: DC
  Administered 2018-10-06 – 2018-10-07 (×2): 8 [IU] via SUBCUTANEOUS
  Administered 2018-10-07 (×2): 11 [IU] via SUBCUTANEOUS
  Administered 2018-10-08 (×2): 15 [IU] via SUBCUTANEOUS
  Administered 2018-10-08: 11 [IU] via SUBCUTANEOUS
  Administered 2018-10-09: 5 [IU] via SUBCUTANEOUS
  Administered 2018-10-09: 11 [IU] via SUBCUTANEOUS

## 2018-10-06 MED ORDER — INSULIN GLARGINE 100 UNIT/ML ~~LOC~~ SOLN
10.0000 [IU] | Freq: Two times a day (BID) | SUBCUTANEOUS | Status: DC
  Administered 2018-10-06 – 2018-10-07 (×3): 10 [IU] via SUBCUTANEOUS
  Filled 2018-10-06 (×4): qty 0.1

## 2018-10-06 MED ORDER — MOMETASONE FURO-FORMOTEROL FUM 200-5 MCG/ACT IN AERO
2.0000 | INHALATION_SPRAY | Freq: Two times a day (BID) | RESPIRATORY_TRACT | Status: DC
  Administered 2018-10-06 – 2018-10-12 (×12): 2 via RESPIRATORY_TRACT
  Filled 2018-10-06 (×2): qty 8.8

## 2018-10-06 MED ORDER — FUROSEMIDE 10 MG/ML IJ SOLN
40.0000 mg | Freq: Once | INTRAMUSCULAR | Status: AC
  Administered 2018-10-06: 40 mg via INTRAVENOUS
  Filled 2018-10-06: qty 4

## 2018-10-06 MED ORDER — KETOROLAC TROMETHAMINE 30 MG/ML IJ SOLN
30.0000 mg | Freq: Once | INTRAMUSCULAR | Status: AC
  Administered 2018-10-06: 30 mg via INTRAVENOUS
  Filled 2018-10-06: qty 1

## 2018-10-06 MED ORDER — SERTRALINE HCL 50 MG PO TABS
50.0000 mg | ORAL_TABLET | Freq: Every day | ORAL | Status: DC
  Administered 2018-10-06 – 2018-10-12 (×7): 50 mg via ORAL
  Filled 2018-10-06 (×7): qty 1

## 2018-10-06 MED ORDER — OXCARBAZEPINE 300 MG PO TABS
1200.0000 mg | ORAL_TABLET | Freq: Every day | ORAL | Status: DC
  Administered 2018-10-06 – 2018-10-11 (×6): 1200 mg via ORAL
  Filled 2018-10-06 (×8): qty 4

## 2018-10-06 MED ORDER — DILTIAZEM HCL-DEXTROSE 100-5 MG/100ML-% IV SOLN (PREMIX)
5.0000 mg/h | INTRAVENOUS | Status: DC
  Administered 2018-10-06 – 2018-10-07 (×2): 12.5 mg/h via INTRAVENOUS
  Filled 2018-10-06 (×5): qty 100

## 2018-10-06 MED ORDER — ARIPIPRAZOLE 5 MG PO TABS
5.0000 mg | ORAL_TABLET | Freq: Every day | ORAL | Status: DC
  Administered 2018-10-06 – 2018-10-12 (×7): 5 mg via ORAL
  Filled 2018-10-06 (×8): qty 1

## 2018-10-06 MED ORDER — FUROSEMIDE 10 MG/ML IJ SOLN
40.0000 mg | Freq: Two times a day (BID) | INTRAMUSCULAR | Status: DC
  Administered 2018-10-06 – 2018-10-07 (×2): 40 mg via INTRAVENOUS
  Filled 2018-10-06 (×2): qty 4

## 2018-10-06 NOTE — Progress Notes (Signed)
  Spoke to Dr. Cyndia Skeeters.  Will hold off on ECHO (Covid +) Please let us know if further assistance is need.   Candee Furbish, MD

## 2018-10-06 NOTE — Progress Notes (Signed)
PROGRESS NOTE  Lauren Cummings VHQ:[id number] DOB: 1961-02-27   PCP: Patient, No Pcp Per  Patient is from: Home  DOA: 10/05/2018 LOS: 1  Brief Narrative / Interim history: 58 year old female with schizoaffective disorder, hypertension, CAD and recent COVID 19+ on 08/29/2018 presenting with left-sided chest pain and admitted for HCAP (RUL pneumonia), mild DKA and elevated liver enzymes.  In ED, afebrile but hypotensive to 80/58.  Desaturated to 89% and started on nasal cannula.  COVID-19 positive.  WBC 22.3.  With bandemia and lymphopenia.  Sodium 131.  Bicarb 20.  Glucose 339.  Creatinine 1.31.  Anion gap 17.  ALP 196.  AST 80.  ALT 71.  Total bili 4.1.  BNP 600.  CK 45.  CK-MB 8.5.  LDH 200.  High-sensitivity troponin 13> 10.  Ferritin 414.  CRP 36.4.  Lactic acid 2.1.  Procalcitonin 6.37.  CTA RUL pneumonia.  Started on aztreonam and azithromycin for RUL pneumonia.  Started on IV fluid and insulin drip for mild DKA.  See individual problem list below for more.   Subjective: Remains on insulin drip and IV fluid overnight.  Tachycardic this morning.  Respiratory rate in the range of 25-30.  Reports shortness of breath and mid chest pain.  Chest pain is reproducible with palpation.  Denies radiation.  Pain worse with movement and lying on her side.  Reports nausea but no emesis.  Denies abdominal pain.  Denies GU symptoms.  Objective: Vitals:   10/06/18 0500 10/06/18 1038 10/06/18 1113 10/06/18 1117  BP:   (!) 93/57 (!) 108/92  Pulse:  97 (!) 115 78  Resp:  (!) 43 (!) 40 (!) 41  Temp:      TempSrc:      SpO2: 92% (!) 81% (!) 85% (!) 78%  Weight:      Height:        Intake/Output Summary (Last 24 hours) at 10/06/2018 1118 Last data filed at 10/06/2018 4132 Gross per 24 hour  Intake 1466.55 ml  Output 300 ml  Net 1166.55 ml   Filed Weights   10/05/18 1705 10/06/18 0100  Weight: 108.9 kg 120.3 kg    Examination:  GENERAL: Seems to have some increased work of breathing. HEENT:  MMM.  Vision and hearing grossly intact.  NECK: Supple.  No apparent JVD but difficult exam.  LUNGS: Some IWOB.  Diminished aeration bilaterally.  Bibasilar crackles. HEART:  RRR. Heart sounds normal.  ABD: Bowel sounds present. Soft. Non tender.  MSK/EXT:  Moves extremities. No apparent deformity or edema.  Tenderness to palpation over LLSB. SKIN: no apparent skin lesion or wound NEURO: Awake, alert and oriented appropriately.  No gross deficit.  PSYCH: Calm. Normal affect.    I have personally reviewed the following labs and images:  Radiology Studies: Ct Angio Chest Pe W And/or Wo Contrast  Result Date: 10/05/2018 CLINICAL DATA:  Chest pain, diaphoresis, hypotension, tachycardia. COVID positive 1 month ago, now cleared to return to work. EXAM: CT ANGIOGRAPHY CHEST CT ABDOMEN AND PELVIS WITH CONTRAST TECHNIQUE: Multidetector CT imaging of the chest was performed using the standard protocol during bolus administration of intravenous contrast. Multiplanar CT image reconstructions and MIPs were obtained to evaluate the vascular anatomy. Multidetector CT imaging of the abdomen and pelvis was performed using the standard protocol during bolus administration of intravenous contrast. CONTRAST:  OMNIPAQUE IOHEXOL 350 MG/ML SOLN COMPARISON:  None. FINDINGS: CTA CHEST FINDINGS Cardiovascular: Preferential opacification of the bilateral pulmonary arteries to the segmental level. No evidence of  pulmonary embolism. No evidence of thoracic aortic aneurysm. The heart is top-normal in size.  No pericardial effusion. Mediastinum/Nodes: 13 mm short axis right paratracheal node (series 5/image 43). 11 mm short axis subcarinal node (series 5/image 56). 2.0 cm short axis right hilar node (series 5/image 50). Sixteen mm short axis right infrahilar node (series 5/image 62). Visualized thyroid is unremarkable. Lungs/Pleura: Consolidation with air bronchograms in the anterior right upper lobe (series 6/image 52),  compatible with pneumonia. Mild subpleural nodularity in the right upper and middle lobe measuring 3-5 mm (series 6/images 71 and 76), likely benign. Mild dependent atelectasis in the bilateral lower lobes. Mild platelike scarring/atelectasis in the right middle lobe and lingula. No pleural effusion or pneumothorax. Musculoskeletal: Mild degenerative changes of the lower thoracic spine. Review of the MIP images confirms the above findings. CT ABDOMEN and PELVIS FINDINGS Hepatobiliary: Liver is within normal limits. Gallbladder is unremarkable. No intrahepatic or extrahepatic ductal dilatation. Pancreas: Within normal limits. Spleen: Within normal limits. Adrenals/Urinary Tract: Adrenal glands are within normal limits. Mild right renal cortical scarring. 2.1 cm medial right upper pole renal cyst (series 17/image 11). Left kidney is within normal limits. No hydronephrosis. Bladder is underdistended but unremarkable. Stomach/Bowel: Stomach is within normal limits. No evidence of bowel obstruction. Prior appendectomy. Vascular/Lymphatic: No evidence of abdominal aortic aneurysm. No suspicious abdominopelvic lymphadenopathy. Reproductive: Status post hysterectomy. No adnexal masses. Other: No abdominopelvic ascites. Musculoskeletal: Degenerative changes at L5-S1. Review of the MIP images confirms the above findings. IMPRESSION: No evidence of pulmonary embolism. Anterior right upper lobe pneumonia. While this may reflect bronchopneumonia, given the patient's clinical history, correlate for pending COVID test results. Unremarkable CT abdomen/pelvis. Electronically Signed   By: Charline BillsSriyesh  Krishnan M.D.   On: 10/05/2018 19:18   Ct Abdomen Pelvis W Contrast  Result Date: 10/05/2018 CLINICAL DATA:  Chest pain, diaphoresis, hypotension, tachycardia. COVID positive 1 month ago, now cleared to return to work. EXAM: CT ANGIOGRAPHY CHEST CT ABDOMEN AND PELVIS WITH CONTRAST TECHNIQUE: Multidetector CT imaging of the chest was  performed using the standard protocol during bolus administration of intravenous contrast. Multiplanar CT image reconstructions and MIPs were obtained to evaluate the vascular anatomy. Multidetector CT imaging of the abdomen and pelvis was performed using the standard protocol during bolus administration of intravenous contrast. CONTRAST:  100mL OMNIPAQUE IOHEXOL 350 MG/ML SOLN COMPARISON:  None. FINDINGS: CTA CHEST FINDINGS Cardiovascular: Preferential opacification of the bilateral pulmonary arteries to the segmental level. No evidence of pulmonary embolism. No evidence of thoracic aortic aneurysm. The heart is top-normal in size.  No pericardial effusion. Mediastinum/Nodes: 13 mm short axis right paratracheal node (series 5/image 43). 11 mm short axis subcarinal node (series 5/image 56). 2.0 cm short axis right hilar node (series 5/image 50). Sixteen mm short axis right infrahilar node (series 5/image 62). Visualized thyroid is unremarkable. Lungs/Pleura: Consolidation with air bronchograms in the anterior right upper lobe (series 6/image 52), compatible with pneumonia. Mild subpleural nodularity in the right upper and middle lobe measuring 3-5 mm (series 6/images 71 and 76), likely benign. Mild dependent atelectasis in the bilateral lower lobes. Mild platelike scarring/atelectasis in the right middle lobe and lingula. No pleural effusion or pneumothorax. Musculoskeletal: Mild degenerative changes of the lower thoracic spine. Review of the MIP images confirms the above findings. CT ABDOMEN and PELVIS FINDINGS Hepatobiliary: Liver is within normal limits. Gallbladder is unremarkable. No intrahepatic or extrahepatic ductal dilatation. Pancreas: Within normal limits. Spleen: Within normal limits. Adrenals/Urinary Tract: Adrenal glands are within normal limits. Mild right  renal cortical scarring. 2.1 cm medial right upper pole renal cyst (series 17/image 11). Left kidney is within normal limits. No hydronephrosis.  Bladder is underdistended but unremarkable. Stomach/Bowel: Stomach is within normal limits. No evidence of bowel obstruction. Prior appendectomy. Vascular/Lymphatic: No evidence of abdominal aortic aneurysm. No suspicious abdominopelvic lymphadenopathy. Reproductive: Status post hysterectomy. No adnexal masses. Other: No abdominopelvic ascites. Musculoskeletal: Degenerative changes at L5-S1. Review of the MIP images confirms the above findings. IMPRESSION: No evidence of pulmonary embolism. Anterior right upper lobe pneumonia. While this may reflect bronchopneumonia, given the patient's clinical history, correlate for pending COVID test results. Unremarkable CT abdomen/pelvis. Electronically Signed   By: Charline BillsSriyesh  Krishnan M.D.   On: 10/05/2018 19:18   Dg Chest Port 1 View  Result Date: 10/05/2018 CLINICAL DATA:  Chest pain for 2 days EXAM: PORTABLE CHEST 1 VIEW COMPARISON:  08/29/2018 FINDINGS: Cardiac shadow is stable. Atelectatic changes in the right base are again identified. Patient is rotated to the right somewhat accentuating the mediastinal markings. Previously seen patchy opacities have predominately resolved in the interval from the prior exam. IMPRESSION: Improved aeration in the lungs bilaterally. Some persistent right basilar atelectasis is noted. Electronically Signed   By: Alcide CleverMark  Lukens M.D.   On: 10/05/2018 18:43    Microbiology: Recent Results (from the past 240 hour(s))  SARS Coronavirus 2 (CEPHEID - Performed in [facility] hospital lab), Hosp Order     Status: Abnormal   Collection Time: 10/05/18  5:40 PM   Specimen: Nasopharyngeal Swab  Result Value Ref Range Status   SARS Coronavirus 2 POSITIVE (A) NEGATIVE Final    Comment: RESULT CALLED TO, READ BACK BY AND VERIFIED WITH: RN Pattricia Boss KENNEDY [phone] MLM (NOTE) If result is NEGATIVE SARS-CoV-2 target nucleic acids are NOT DETECTED. The SARS-CoV-2 RNA is generally detectable in upper and lower  respiratory specimens during the  acute phase of infection. The lowest  concentration of SARS-CoV-2 viral copies this assay can detect is 250  copies / mL. A negative result does not preclude SARS-CoV-2 infection  and should not be used as the sole basis for treatment or other  patient management decisions.  A negative result may occur with  improper specimen collection / handling, submission of specimen other  than nasopharyngeal swab, presence of viral mutation(s) within the  areas targeted by this assay, and inadequate number of viral copies  (<250 copies / mL). A negative result must be combined with clinical  observations, patient history, and epidemiological information. If result is POSITIVE SARS-CoV-2 target nucleic acids are DETECTED. The SARS -CoV-2 RNA is generally detectable in upper and lower  respiratory specimens during the acute phase of infection.  Positive  results are indicative of active infection with SARS-CoV-2.  Clinical  correlation with patient history and other diagnostic information is  necessary to determine patient infection status.  Positive results do  not rule out bacterial infection or co-infection with other viruses. If result is PRESUMPTIVE POSTIVE SARS-CoV-2 nucleic acids MAY BE PRESENT.   A presumptive positive result was obtained on the submitted specimen  and confirmed on repeat testing.  While 2019 novel coronavirus  (SARS-CoV-2) nucleic acids may be present in the submitted sample  additional confirmatory testing may be necessary for epidemiological  and / or clinical management purposes  to differentiate between  SARS-CoV-2 and other Sarbecovirus currently known to infect humans.  If clinically indicated additional testing with an alternate test  methodology [phone]([id number]) is advise d. The SARS-CoV-2 RNA is generally  detectable in upper and lower respiratory specimens during the acute  phase of infection. The expected result is Negative. Fact Sheet for Patients:   StrictlyIdeas.no Fact Sheet for Healthcare Providers: BankingDealers.co.za This test is not yet approved or cleared by the [place] FDA and has been authorized for detection and/or diagnosis of SARS-CoV-2 by FDA under an Emergency Use Authorization (EUA).  This EUA will remain in effect (meaning this test can be used) for the duration of the COVID-19 declaration under Section 564(b)(1) of the Act, 21 U.S.C. section 360bbb-3(b)(1), unless the authorization is terminated or revoked sooner. Performed at [facility], [place] [address] [place], [place] [address]     Sepsis Labs: Invalid input(s): PROCALCITONIN, LACTICIDVEN  Urine analysis: No results found for: COLORURINE, APPEARANCEUR, LABSPEC, PHURINE, GLUCOSEU, HGBUR, BILIRUBINUR, KETONESUR, PROTEINUR, UROBILINOGEN, NITRITE, LEUKOCYTESUR  Anemia Panel: Recent Labs    10/05/18 2020  FERRITIN 414*    Thyroid Function Tests: No results for input(s): TSH, T4TOTAL, FREET4, T3FREE, THYROIDAB in the last 72 hours.  Lipid Profile: No results for input(s): CHOL, HDL, LDLCALC, TRIG, CHOLHDL, LDLDIRECT in the last 72 hours.  CBG: Recent Labs  Lab 10/06/18 0354 10/06/18 0508 10/06/18 0624 10/06/18 0822 10/06/18 0945  GLUCAP 134* 133* 127* 171* 188*    HbA1C: Recent Labs    10/06/18 0707  HGBA1C 7.8*    BNP (last 3 results): No results for input(s): PROBNP in the last 8760 hours.  Cardiac Enzymes: Recent Labs  Lab 10/05/18 2020 10/06/18 0707  CKTOTAL 45 44  CKMB 8.5*  --     Coagulation Profile: No results for input(s): INR, PROTIME in the last 168 hours.  Liver Function Tests: Recent Labs  Lab 10/05/18 1705 10/06/18 0707  AST 80* 36  ALT 71* 50*  ALKPHOS 196* 169*  BILITOT 4.1* 3.2*  PROT 7.7 7.0  ALBUMIN 2.9* 2.4*   No results for input(s): LIPASE, AMYLASE in the last 168 hours. No results for input(s): AMMONIA in the last 168 hours.  Basic  Metabolic Panel: Recent Labs  Lab 10/05/18 1705 10/05/18 2318 10/06/18 0707  NA 131* 133* 135  K 3.6 3.6 3.6  CL 94* 101 101  CO2 20* 23 22  GLUCOSE 339* 252* 158*  BUN 11 13 13   CREATININE 1.31* 0.83 0.82  CALCIUM 9.1 8.5* 8.9   GFR: Estimated Creatinine Clearance: 107.1 mL/min (by C-G formula based on SCr of 0.82 mg/dL).  CBC: Recent Labs  Lab 10/05/18 1705 10/06/18 0707  WBC 22.3* 18.6*  NEUTROABS  --  17.0*  HGB 13.4 12.4  HCT 39.1 36.6  MCV 80.0 80.3  PLT 328 318    Procedures:  None  Microbiology summarized: COVID-19 positive.  Assessment & Plan: Hypoxic respiratory failure: Multifactorial including RUL pneumonia, possible pulmonary congestion from IV fluid and A. fib with RVR.  COVID-19 positive but doubt active process this far out although she had some elevated inflammatory markers, LFT and lymphopenia. -IV Lasix 40 mg once -Discontinue all IV fluids -Start portable chest x-ray -Treat individual causes as below -Supplemental oxygen  RUL pneumonia/hypoxic respiratory failure in patient with COVID-19 virus: Markedly elevated procalcitonin favors bacterial infection. -Continue aztreonam and azithromycin-history of anaphylaxis with penicillin -Start IV Solu-Medrol-this could help for bacterial pneumonia -No Remdesivir at this time-discussed with Dr. Lake Bells  New A. fib with RVR: Suspect this to be due to fluid overload from IV fluids for DKA.  BNP was 600 on admission.  -IV Lasix as above-will reassess and re-dose -Cardizem bolus  and drip. -Lovenox for anticoagulation -Discussed with cardiology, Dr. Anne FuSkains who is in agreement. He also recommended holding off Echo now.   Atypical chest pain: Likely musculoskeletal.  It is reproducible.  High-sensitivity troponin negative.  No acute ischemic finding on chest x-ray. -Repeat high-sensitivity troponin-could be elevated due to A. fib with RVR and pulmonary congestion.  History of CAD/MI?:  This history is  vague.  Chest pain is musculoskeletal.  High-sensitivity troponin and EKG reassuring.  -Continue monitoring -Needs to be on a statin given history of diabetes.  Elevated BNP: BNP elevated to 600 on admission (no prior to compare to).  No history of CHF but history of CAD. -Discontinued IV fluids -IV Lasix 40 mg once-will reassess and re-dose. -Daily weight, intake output and renal functions -Discussed with cardiology, Dr. Anne FuSkains who recommended holding off echo now  Elevated liver enzymes/hyperbilirubinemia: Unclear etiology of this.  Congestive hepatopathy?  COVID-19?  Psych meds?.  Liver enzymes improved. -Fractionate bili -Continue trending  Suboptimally controlled NIDDM-2/mild DKA: A1c 7.8%.  On insulin, metformin and Ste[place]o at home -Hold home medications -Transition to subcu insulin -Discontinue IV fluids  Schizoaffective disorder: Stable.  Has not filled most of her psych medications recently. -Psychiatry consulted  Hypertension: Normotensive for most part -IV Lasix as above -Resume for A. fib  History of asthma?  On Advair at home. [facility] and as needed albuterol  DVT prophylaxis: Full dose Lovenox for A. fib Code Status: Full code Family Communication: Patient and/or RN. Available if any question.  Disposition Plan: Transfer to [facility] once stabilized  Consultants: None   Antimicrobials: Anti-infectives (From admission, onward)   Start     Dose/Rate Route Frequency Ordered Stop   10/06/18 1000  vancomycin (VANCOCIN) IVPB 750 mg/150 ml premix     750 mg 150 mL/hr over 60 Minutes Intravenous Every 12 hours 10/05/18 2038     10/05/18 2200  vancomycin (VANCOCIN) 2,000 mg in sodium chloride 0.9 % 500 mL IVPB     2,000 mg 250 mL/hr over 120 Minutes Intravenous  Once 10/05/18 2038 10/06/18 0044   10/05/18 2200  azithromycin (ZITHROMAX) 500 mg in sodium chloride 0.9 % 250 mL IVPB     500 mg 250 mL/hr over 60 Minutes Intravenous Every 24 hours 10/05/18 2107     10/05/18  2100  aztreonam (AZACTAM) 2 g in sodium chloride 0.9 % 100 mL IVPB     2 g 200 mL/hr over 30 Minutes Intravenous Every 8 hours 10/05/18 2038     10/05/18 1930  levofloxacin (LEVAQUIN) IVPB 750 mg  Status:  Discontinued     750 mg 100 mL/hr over 90 Minutes Intravenous  Once 10/05/18 1924 10/05/18 2040      Sch Meds:  Scheduled Meds:  enoxaparin (LOVENOX) injection  40 mg Subcutaneous Q24H   insulin aspart  0-15 Units Subcutaneous TID WC   insulin aspart  0-5 Units Subcutaneous QHS   insulin glargine  10 Units Subcutaneous BID   mouth rinse  15 mL Mouth Rinse BID   oxcarbazepine  1,200 mg Oral QHS   Continuous Infusions:  azithromycin Stopped (10/06/18 0000)   aztreonam 2 g (10/06/18 0611)   diltiazem (CARDIZEM) infusion     vancomycin 750 mg (10/06/18 1005)   PRN Meds:.  Azariya Freeman T. Kaedynce Tapp [facility]  If 7PM-7AM, please contact night-coverage [email or web address] Password [email or web address] 10/06/2018, 11:18 AM

## 2018-10-06 NOTE — Progress Notes (Signed)
Patient refused lab work this am.  Lab will try again on last rounds. FYI sent to [facility] NP to let her know.  Patient has been under 180 for her CBG for the past 3 hours.  Patient has not been happy with being NPO but seemed to settle after being allowed ice chips.  She states that she needs her psych meds, per admitting MD note, there is mention to continue meds.  Will have RN to follow up with MD on rounds in am.

## 2018-10-06 NOTE — Progress Notes (Signed)
Patient's CBG is under 180 x 4 hours.  Page sent to [facility] NP pass to Attending to work on transitioning to SQ insulin. Patient has been sleeping and tired past 4 hours, less anxious.  She is still concerned about getting off the insulin gtt.

## 2018-10-06 NOTE — Consult Note (Signed)
Spoke with Dr. Cyndia Skeeters regarding [name] psychiatric medication. Per reports patient  has not been taking her medications since June 15 but would like to re-start meds even though she is stable mentally. Recommendations:  -Abilify 5 mg daily, Sertraline 50 mg daily -Continue Trileptal 1200 mg at bedtime. -Refer patient to her psychiatrist upon discharge  Lauren Pilgrim, MD Attending psychiatrist.

## 2018-10-06 NOTE — Progress Notes (Signed)
RN called for a second set of hands. Pt in new onset A-Fib RVR. New orders given to start on Cardizem gtt. HR 140's, RR 38-40, sats 88% on 2L [place], BP 108/92. Gave assistance to RN with triturating Cardizem gtt. Placed pt on NRB 15L with the plan to wean back to 2L [place]. Pt extremely anxious and trying to talk on the phone with family. I spoke with pt about placing phone on do not disturb and try to relax and rest for an hour or so as her O2 sats were dropping. HR 114, BP 116/75, RR 26-30, sats 96-98% 15L NRB

## 2018-10-06 NOTE — Progress Notes (Signed)
[place] for Lovenox Indication: atrial fibrillation  Allergies  Allergen Reactions  . Banana Anaphylaxis  . Coconut Flavor Anaphylaxis  . Keflex [Cephalexin] Anaphylaxis  . Penicillins Anaphylaxis    Did it involve swelling of the face/tongue/throat, SOB, or low BP? yes Did it involve sudden or severe rash/hives, skin peeling, or any reaction on the inside of your mouth or nose? no Did you need to seek medical attention at a hospital or doctor's office? yes When did it last happen?8 years ago If all above answers are "NO", may proceed with cephalosporin use.  Marland Kitchen Pineapple Anaphylaxis  . Other Hives    All berries cause hives   Patient Measurements: Height: 5' 9.5" (176.5 cm) Weight: 265 lb 3.4 oz (120.3 kg) IBW/kg (Calculated) : 67.35 Heparin Dosing Weight: 91.6 kg  Vital Signs: Temp: 97.8 F (36.6 C) (07/12 0049) Temp Source: Oral (07/12 0049) BP: 97/70 (07/12 1140) Pulse Rate: 95 (07/12 1140)  Labs: Recent Labs    10/05/18 1705 10/05/18 2020 10/05/18 2318 10/06/18 0707  HGB 13.4  --   --  12.4  HCT 39.1  --   --  36.6  PLT 328  --   --  318  CREATININE 1.31*  --  0.83 0.82  CKTOTAL  --  45  --  44  CKMB  --  8.5*  --   --   TROPONINIHS 13 10  --   --    Estimated Creatinine Clearance: 107.1 mL/min (by C-G formula based on SCr of 0.82 mg/dL).  Medical History: Past Medical History:  Diagnosis Date  . Diabetes mellitus without complication ([place])   . Hypertension   . MI (myocardial infarction) ([place])   . Schizophrenia ([place])    Medications:  Scheduled:  . enoxaparin (LOVENOX) injection  90 mg Subcutaneous Q12H  . insulin aspart  0-15 Units Subcutaneous TID WC  . insulin aspart  0-5 Units Subcutaneous QHS  . insulin glargine  10 Units Subcutaneous BID  . mouth rinse  15 mL Mouth Rinse BID  . mometasone-formoterol  2 puff Inhalation BID  . oxcarbazepine  1,200 mg Oral QHS   Infusions:  . azithromycin Stopped  (10/06/18 0000)  . aztreonam 2 g (10/06/18 9211)  . diltiazem (CARDIZEM) infusion 10 mg/hr (10/06/18 1151)  . vancomycin 750 mg (10/06/18 1005)    Assessment: 27 yoF admit with chest pain, Covid +, new Afib. Begin Lovenox per Rx. Patient received Lovenox 40mg  x1 this am 7/12 at 10am  Goal of Therapy:  Monitor platelets by anticoagulation protocol: Yes   Plan:  Lovenox 90mg  SQ bid, begin 8pm with am Lovenox given Monitor s/s bleed, CBC  Minda Ditto PharmD [phone] 10/06/2018,11:51 AM

## 2018-10-07 LAB — COMPREHENSIVE METABOLIC PANEL
ALT: 36 U/L (ref 0–44)
AST: 16 U/L (ref 15–41)
Albumin: 2.1 g/dL — ABNORMAL LOW (ref 3.5–5.0)
Alkaline Phosphatase: 152 U/L — ABNORMAL HIGH (ref 38–126)
Anion gap: 13 (ref 5–15)
BUN: 28 mg/dL — ABNORMAL HIGH (ref 6–20)
CO2: 22 mmol/L (ref 22–32)
Calcium: 9.3 mg/dL (ref 8.9–10.3)
Chloride: 100 mmol/L (ref 98–111)
Creatinine, Ser: 0.88 mg/dL (ref 0.44–1.00)
GFR calc Af Amer: >60 mL/min (ref >60)
GFR calc non Af Amer: >60 mL/min (ref >60)
Glucose, Bld: 333 mg/dL — ABNORMAL HIGH (ref 70–99)
Potassium: 3.7 mmol/L (ref 3.5–5.1)
Sodium: 135 mmol/L (ref 135–145)
Total Bilirubin: 1.5 mg/dL — ABNORMAL HIGH (ref 0.3–1.2)
Total Protein: 6.7 g/dL (ref 6.5–8.1)

## 2018-10-07 LAB — CBC WITH DIFFERENTIAL/PLATELET
Abs Immature Granulocytes: 0.39 10*3/uL — ABNORMAL HIGH (ref 0.00–0.07)
Basophils Absolute: 0 10*3/uL (ref 0.0–0.1)
Basophils Relative: 0 %
Eosinophils Absolute: 0 10*3/uL (ref 0.0–0.5)
Eosinophils Relative: 0 %
HCT: 34.1 % — ABNORMAL LOW (ref 36.0–46.0)
Hemoglobin: 11.7 g/dL — ABNORMAL LOW (ref 12.0–15.0)
Immature Granulocytes: 2 %
Lymphocytes Relative: 4 %
Lymphs Abs: 0.9 10*3/uL (ref 0.7–4.0)
MCH: 27.3 pg (ref 26.0–34.0)
MCHC: 34.3 g/dL (ref 30.0–36.0)
MCV: 79.5 fL — ABNORMAL LOW (ref 80.0–100.0)
Monocytes Absolute: 0.6 10*3/uL (ref 0.1–1.0)
Monocytes Relative: 3 %
Neutro Abs: 20.8 10*3/uL — ABNORMAL HIGH (ref 1.7–7.7)
Neutrophils Relative %: 91 %
Platelets: 406 10*3/uL — ABNORMAL HIGH (ref 150–400)
RBC: 4.29 MIL/uL (ref 3.87–5.11)
RDW: 14 % (ref 11.5–15.5)
WBC: 22.7 10*3/uL — ABNORMAL HIGH (ref 4.0–10.5)
nRBC: 0 % (ref 0.0–0.2)

## 2018-10-07 LAB — GLUCOSE, CAPILLARY
Glucose-Capillary: 309 mg/dL — ABNORMAL HIGH (ref 70–99)
Glucose-Capillary: 323 mg/dL — ABNORMAL HIGH (ref 70–99)
Glucose-Capillary: 299 mg/dL — ABNORMAL HIGH (ref 70–99)
Glucose-Capillary: 172 mg/dL — ABNORMAL HIGH (ref 70–99)

## 2018-10-07 LAB — HEPATITIS PANEL, ACUTE
HCV Ab: <0.1 s/co ratio (ref 0.0–0.9)
Hep A IgM: NEGATIVE
Hep B C IgM: NEGATIVE
Hepatitis B Surface Ag: NEGATIVE

## 2018-10-07 LAB — MAGNESIUM: Magnesium: 2 mg/dL (ref 1.7–2.4)

## 2018-10-07 LAB — LEGIONELLA PNEUMOPHILA SEROGP 1 UR AG: L. pneumophila Serogp 1 Ur Ag: NEGATIVE

## 2018-10-07 LAB — LACTATE DEHYDROGENASE: LDH: 161 U/L (ref 98–192)

## 2018-10-07 LAB — LACTIC ACID, PLASMA: Lactic Acid, Venous: 2.6 mmol/L (ref 0.5–1.9)

## 2018-10-07 LAB — CK: Total CK: 46 U/L (ref 38–234)

## 2018-10-07 LAB — FERRITIN: Ferritin: 615 ng/mL — ABNORMAL HIGH (ref 11–307)

## 2018-10-07 LAB — D-DIMER, QUANTITATIVE: D-Dimer, Quant: 3.13 ug/mL-FEU — ABNORMAL HIGH (ref 0.00–0.50)

## 2018-10-07 MED ORDER — ACETAMINOPHEN 325 MG PO TABS
650.0000 mg | ORAL_TABLET | Freq: Four times a day (QID) | ORAL | Status: DC | PRN
  Administered 2018-10-07 – 2018-10-09 (×4): 650 mg via ORAL
  Filled 2018-10-07 (×4): qty 2

## 2018-10-07 MED ORDER — DILTIAZEM HCL 30 MG PO TABS: 30.0000 mg | ORAL_TABLET | Freq: Four times a day (QID) | ORAL | Status: DC

## 2018-10-07 MED ORDER — METHYLPREDNISOLONE SODIUM SUCC 40 MG IJ SOLR
40.0000 mg | Freq: Four times a day (QID) | INTRAMUSCULAR | Status: DC
  Administered 2018-10-09 – 2018-10-12 (×14): 40 mg via INTRAVENOUS
  Filled 2018-10-07 (×14): qty 1

## 2018-10-07 MED ORDER — INSULIN GLARGINE 100 UNIT/ML ~~LOC~~ SOLN
15.0000 [IU] | Freq: Two times a day (BID) | SUBCUTANEOUS | Status: DC
  Administered 2018-10-07 – 2018-10-08 (×2): 15 [IU] via SUBCUTANEOUS
  Filled 2018-10-07 (×3): qty 0.15

## 2018-10-07 MED ORDER — ENOXAPARIN SODIUM 30 MG/0.3ML ~~LOC~~ SOLN
30.0000 mg | Freq: Once | SUBCUTANEOUS | Status: AC
  Administered 2018-10-07: 30 mg via SUBCUTANEOUS
  Filled 2018-10-07: qty 0.3

## 2018-10-07 MED ORDER — ENOXAPARIN SODIUM 120 MG/0.8ML ~~LOC~~ SOLN: 120.0000 mg | Freq: Two times a day (BID) | SUBCUTANEOUS | Status: DC

## 2018-10-07 MED ORDER — METHYLPREDNISOLONE SODIUM SUCC 125 MG IJ SOLR
60.0000 mg | INTRAMUSCULAR | Status: AC
  Administered 2018-10-07 – 2018-10-08 (×9): 60 mg via INTRAVENOUS
  Filled 2018-10-07 (×9): qty 2

## 2018-10-07 MED ORDER — ENOXAPARIN SODIUM 120 MG/0.8ML ~~LOC~~ SOLN
120.0000 mg | Freq: Two times a day (BID) | SUBCUTANEOUS | Status: DC
  Administered 2018-10-07 – 2018-10-12 (×10): 120 mg via SUBCUTANEOUS
  Filled 2018-10-07 (×12): qty 0.8

## 2018-10-07 MED ORDER — METOPROLOL TARTRATE 50 MG PO TABS
50.0000 mg | ORAL_TABLET | Freq: Two times a day (BID) | ORAL | Status: DC
  Administered 2018-10-07 – 2018-10-12 (×7): 50 mg via ORAL
  Filled 2018-10-07 (×8): qty 1

## 2018-10-07 MED ORDER — INSULIN ASPART 100 UNIT/ML ~~LOC~~ SOLN
8.0000 [IU] | Freq: Three times a day (TID) | SUBCUTANEOUS | Status: DC
  Administered 2018-10-07 – 2018-10-12 (×15): 8 [IU] via SUBCUTANEOUS

## 2018-10-07 MED ORDER — ENOXAPARIN SODIUM 100 MG/ML ~~LOC~~ SOLN: 90.0000 mg | Freq: Once | SUBCUTANEOUS | Status: DC

## 2018-10-07 MED ORDER — SODIUM CHLORIDE 0.9 % IV SOLN
Freq: Once | INTRAVENOUS | Status: AC
  Administered 2018-10-07: 17:00:00 via INTRAVENOUS

## 2018-10-07 NOTE — Progress Notes (Signed)
Inpatient Diabetes Program Recommendations  AACE/ADA: New Consensus Statement on Inpatient Glycemic Control (2015)  Target Ranges:  Prepandial:   less than 140 mg/dL      Peak postprandial:   less than 180 mg/dL (1-2 hours)      Critically ill patients:  140 - 180 mg/dL   Lab Results  Component Value Date   GLUCAP 309 (H) 10/07/2018   HGBA1C 7.8 (H) 10/06/2018    Review of Glycemic Control Results for Lauren Cummings, Lauren Cummings (MRN [id number]) as of 10/07/2018 10:24  Ref. Range 10/06/2018 16:24 10/06/2018 21:10 10/07/2018 07:43  Glucose-Capillary Latest Ref Range: 70 - 99 mg/dL 278 (H) 399 (H) 309 (H)   Diabetes history: Type 2 DM Outpatient Diabetes medications: Metformin 1000 mg BID, Lantus 20 units BID, Admelog 12 units TID Current orders for Inpatient glycemic control: Lantus 10 units BID, Novolog 0-15 unit TID, Novolog 0-5 units QHS Solumedrol 125 mg X1  Inpatient Diabetes Program Recommendations:    FSBG 309 mg/dL and patient received steroids  Consider the following: -Increase Lantus to 18 units BID -Add Novolog 5 units TID (assuming patient is consuming >50% of meal).  Thanks, Bronson Curb, MSN, RNC-OB Diabetes Coordinator [phone] (8a-5p)

## 2018-10-07 NOTE — Progress Notes (Signed)
BP soft  MD made aware, will continue to monitor

## 2018-10-07 NOTE — Progress Notes (Addendum)
[place] for Lovenox Indication: atrial fibrillation  Allergies  Allergen Reactions  . Banana Anaphylaxis  . Coconut Flavor Anaphylaxis  . Keflex [Cephalexin] Anaphylaxis  . Penicillins Anaphylaxis    Did it involve swelling of the face/tongue/throat, SOB, or low BP? yes Did it involve sudden or severe rash/hives, skin peeling, or any reaction on the inside of your mouth or nose? no Did you need to seek medical attention at a hospital or doctor's office? yes When did it last happen?8 years ago If all above answers are "NO", may proceed with cephalosporin use.  Marland Kitchen Pineapple Anaphylaxis  . Other Hives    All berries cause hives   Patient Measurements: Height: 5' 9.5" (176.5 cm) Weight: 259 lb 7.7 oz (117.7 kg) IBW/kg (Calculated) : 67.35 Heparin Dosing Weight: 91.6 kg  Vital Signs: Temp: 97.2 F (36.2 C) (07/13 0746) Temp Source: Oral (07/13 0746) BP: 128/77 (07/13 0746) Pulse Rate: 86 (07/13 0746)  Labs: Recent Labs    10/05/18 1705 10/05/18 2020 10/05/18 2318 10/06/18 0707 10/06/18 1255 10/06/18 1659 10/06/18 1827 10/07/18 0645  HGB 13.4  --   --  12.4  --   --   --  11.7*  HCT 39.1  --   --  36.6  --   --   --  34.1*  PLT 328  --   --  318  --   --   --  406*  CREATININE 1.31*  --  0.83 0.82  --   --  0.87  --   CKTOTAL  --  45  --  44  --   --   --   --   CKMB  --  8.5*  --   --   --   --   --   --   TROPONINIHS 13 10  --   --  36* 37*  --   --    Estimated Creatinine Clearance: 99.7 mL/min (by C-G formula based on SCr of 0.87 mg/dL).  Medical History: Past Medical History:  Diagnosis Date  . Diabetes mellitus without complication ([place])   . Hypertension   . MI (myocardial infarction) ([place])   . Schizophrenia ([place])    Medications:  Scheduled:  . ARIPiprazole  5 mg Oral Daily  . enoxaparin (LOVENOX) injection  90 mg Subcutaneous Q12H  . furosemide  40 mg Intravenous BID  . insulin aspart  0-15 Units  Subcutaneous TID WC  . insulin aspart  0-5 Units Subcutaneous QHS  . insulin glargine  10 Units Subcutaneous BID  . mouth rinse  15 mL Mouth Rinse BID  . mometasone-formoterol  2 puff Inhalation BID  . oxcarbazepine  1,200 mg Oral QHS  . sertraline  50 mg Oral Daily   Infusions:  . azithromycin 500 mg (10/06/18 2122)  . aztreonam 2 g (10/07/18 0345)  . diltiazem (CARDIZEM) infusion 12.5 mg/hr (10/06/18 2247)  . vancomycin 750 mg (10/06/18 2358)    Assessment: Lauren Cummings admit with chest pain, Covid +, new Afib. Begin Lovenox per Rx.   Goal of Therapy:  Monitor platelets by anticoagulation protocol: Yes   Plan:  Give additional 30 mg this AM Increase to Lovenox 120 mg SQ bid (1 mg/kg) starting tonight Monitor s/s bleed, CBC  Berenice Bouton, PharmD PGY1 Pharmacy Resident Office phone: [phone]  10/07/2018,8:12 AM

## 2018-10-07 NOTE — Progress Notes (Signed)
PROGRESS NOTE  Lauren Cummings JJO:[id number] DOB: May 03, 1960   PCP: Patient, No Pcp Per  Patient is from: Home  DOA: 10/05/2018 LOS: 2  Brief Narrative / Interim history: 58 year old female with schizoaffective disorder, hypertension, CAD and recent COVID 19+ on 08/29/2018 presenting with left-sided chest pain and admitted for HCAP (RUL pneumonia), mild DKA and elevated liver enzymes.  In ED, afebrile but hypotensive to 80/58.  Desaturated to 89% and started on nasal cannula.  COVID-19 positive.  WBC 22.3.  With bandemia and lymphopenia.  Sodium 131.  Bicarb 20.  Glucose 339.  Creatinine 1.31.  Anion gap 17.  ALP 196.  AST 80.  ALT 71.  Total bili 4.1.  BNP 600.  CK 45.  CK-MB 8.5.  LDH 200.  High-sensitivity troponin 13> 10.  Ferritin 414.  CRP 36.4.  Lactic acid 2.1.  Procalcitonin 6.37.  CTA RUL pneumonia.  Started on aztreonam and azithromycin for RUL pneumonia.  Started on IV fluid and insulin drip for mild DKA.  See individual problem list below for more.  Subjective: No major events overnight of this morning.  Remained in sinus rhythm with heart rate in 80s on Cardizem drip.  Respiratory rate in 20s.  On 5 L by high flow nasal cannula.  Continues to endorse pain across his chest from right to left shoulder mainly on the right.  Pain is worse with movement.  Breathing improved.  Denies GI or GU symptoms.  Over 2 L of urine output with 2 unmeasured voids.  CBG remains elevated likely due to steroid.  Anion gap remains closed.  Bicarb stable.  Patient refuses transfer to [facility].  She says she was there last month.   Objective: Vitals:   10/06/18 1945 10/06/18 2300 10/07/18 0330 10/07/18 0746  BP:  111/65 124/70 128/77  Pulse:  71  86  Resp: (!) 26 19 (!) 27 (!) 36  Temp:  97.6 F (36.4 C) (!) 97.1 F (36.2 C) (!) 97.2 F (36.2 C)  TempSrc:  Oral Oral Oral  SpO2: 95% 100% 94% 97%  Weight:   117.7 kg   Height:        Intake/Output Summary (Last 24 hours) at 10/07/2018 1134 Last data  filed at 10/07/2018 1032 Gross per 24 hour  Intake 1406.34 ml  Output 3650 ml  Net -2243.66 ml   Filed Weights   10/05/18 1705 10/06/18 0100 10/07/18 0330  Weight: 108.9 kg 120.3 kg 117.7 kg    Examination:  GENERAL: No acute distress.  HEENT: MMM.  Vision and hearing grossly intact.  NECK: Supple.  No apparent JVD but difficult exam RESP:  No IWOB.  Fair aeration bilaterally.  Bibasilar crackles. CVS:  RRR.  Distant heart sounds but normal. ABD/GI/GU: Bowel sounds present. Soft. Non tender.  MSK/EXT:  Moves extremities. No apparent deformity or edema.  SKIN: no apparent skin lesion or wound NEURO: Awake, alert and oriented appropriately.  No gross deficit.  PSYCH: Calm. Normal affect.   I have personally reviewed the following labs and images:  Radiology Studies: Dg Chest Port 1 View  Result Date: 10/06/2018 CLINICAL DATA:  Dyspnea. Pneumonia due to COVID-19 virus. EXAM: PORTABLE CHEST 1 VIEW COMPARISON:  10/05/2018 FINDINGS: Increased heart size since prior study. Low lung volumes again noted. Worsening consolidation is seen in the inferior aspect of the right upper lobe. Increased airspace opacity is also seen in both lower lobes with associated air bronchograms. No evidence of pleural effusion. IMPRESSION: Worsening right upper lobe consolidation and  increased bilateral lower lobe airspace disease. Increased heart size. Electronically Signed   By: Danae OrleansJohn A Stahl M.D.   On: 10/06/2018 13:37    Microbiology: Recent Results (from the past 240 hour(s))  SARS Coronavirus 2 (CEPHEID - Performed in [facility] hospital lab), Hosp Order     Status: Abnormal   Collection Time: 10/05/18  5:40 PM   Specimen: Nasopharyngeal Swab  Result Value Ref Range Status   SARS Coronavirus 2 POSITIVE (A) NEGATIVE Final    Comment: RESULT CALLED TO, READ BACK BY AND VERIFIED WITH: RN Pattricia Boss KENNEDY [phone] MLM (NOTE) If result is NEGATIVE SARS-CoV-2 target nucleic acids are NOT DETECTED. The  SARS-CoV-2 RNA is generally detectable in upper and lower  respiratory specimens during the acute phase of infection. The lowest  concentration of SARS-CoV-2 viral copies this assay can detect is 250  copies / mL. A negative result does not preclude SARS-CoV-2 infection  and should not be used as the sole basis for treatment or other  patient management decisions.  A negative result may occur with  improper specimen collection / handling, submission of specimen other  than nasopharyngeal swab, presence of viral mutation(s) within the  areas targeted by this assay, and inadequate number of viral copies  (<250 copies / mL). A negative result must be combined with clinical  observations, patient history, and epidemiological information. If result is POSITIVE SARS-CoV-2 target nucleic acids are DETECTED. The SARS -CoV-2 RNA is generally detectable in upper and lower  respiratory specimens during the acute phase of infection.  Positive  results are indicative of active infection with SARS-CoV-2.  Clinical  correlation with patient history and other diagnostic information is  necessary to determine patient infection status.  Positive results do  not rule out bacterial infection or co-infection with other viruses. If result is PRESUMPTIVE POSTIVE SARS-CoV-2 nucleic acids MAY BE PRESENT.   A presumptive positive result was obtained on the submitted specimen  and confirmed on repeat testing.  While 2019 novel coronavirus  (SARS-CoV-2) nucleic acids may be present in the submitted sample  additional confirmatory testing may be necessary for epidemiological  and / or clinical management purposes  to differentiate between  SARS-CoV-2 and other Sarbecovirus currently known to infect humans.  If clinically indicated additional testing with an alternate test  methodology [phone]([id number]) is advise d. The SARS-CoV-2 RNA is generally  detectable in upper and lower respiratory specimens during the acute   phase of infection. The expected result is Negative. Fact Sheet for Patients:  BoilerBrush.com.cyhttps://www.fda.gov/media/136312/download Fact Sheet for Healthcare Providers: https://pope.com/https://www.fda.gov/media/136313/download This test is not yet approved or cleared by the [place] FDA and has been authorized for detection and/or diagnosis of SARS-CoV-2 by FDA under an Emergency Use Authorization (EUA).  This EUA will remain in effect (meaning this test can be used) for the duration of the COVID-19 declaration under Section 564(b)(1) of the Act, 21 U.S.C. section 360bbb-3(b)(1), unless the authorization is terminated or revoked sooner. Performed at [facility], [address] [place], [place] [address]   Culture, blood (routine x 2)     Status: None (Preliminary result)   Collection Time: 10/05/18  8:21 PM   Specimen: BLOOD  Result Value Ref Range Status   Specimen Description BLOOD RIGHT ANTECUBITAL  Final   Special Requests   Final    BOTTLES DRAWN AEROBIC AND ANAEROBIC Blood Culture results may not be optimal due to an excessive volume of blood received in culture bottles   Culture  Final    NO GROWTH < 24 HOURS Performed at [facility], [address] [place], [place] [address]    Report Status PENDING  Incomplete    Sepsis Labs: Invalid input(s): PROCALCITONIN, LACTICIDVEN  Urine analysis: No results found for: COLORURINE, APPEARANCEUR, LABSPEC, PHURINE, GLUCOSEU, HGBUR, BILIRUBINUR, KETONESUR, PROTEINUR, UROBILINOGEN, NITRITE, LEUKOCYTESUR  Anemia Panel: Recent Labs    10/05/18 2020 10/07/18 0645  FERRITIN 414* 615*    Thyroid Function Tests: Recent Labs    10/05/18 2020 10/06/18 1659  TSH 1.536  --   FREET4  --  1.47*    Lipid Profile: No results for input(s): CHOL, HDL, LDLCALC, TRIG, CHOLHDL, LDLDIRECT in the last 72 hours.  CBG: Recent Labs  Lab 10/06/18 1147 10/06/18 1257 10/06/18 1624 10/06/18 2110 10/07/18 0743  GLUCAP 221* 189* 278* 399* 309*     HbA1C: Recent Labs    10/06/18 0707  HGBA1C 7.8*    BNP (last 3 results): No results for input(s): PROBNP in the last 8760 hours.  Cardiac Enzymes: Recent Labs  Lab 10/05/18 2020 10/06/18 0707 10/07/18 0645  CKTOTAL 45 44 46  CKMB 8.5*  --   --     Coagulation Profile: No results for input(s): INR, PROTIME in the last 168 hours.  Liver Function Tests: Recent Labs  Lab 10/05/18 1705 10/06/18 0707 10/06/18 1255 10/07/18 0645  AST 80* 36  --  16  ALT 71* 50*  --  36  ALKPHOS 196* 169*  --  152*  BILITOT 4.1* 3.2* 2.9* 1.5*  PROT 7.7 7.0  --  6.7  ALBUMIN 2.9* 2.4*  --  2.1*   No results for input(s): LIPASE, AMYLASE in the last 168 hours. No results for input(s): AMMONIA in the last 168 hours.  Basic Metabolic Panel: Recent Labs  Lab 10/05/18 1705 10/05/18 2318 10/06/18 0707 10/06/18 1827 10/07/18 0645  NA 131* 133* 135 133* 135  K 3.6 3.6 3.6 4.1 3.7  CL 94* 101 101 102 100  CO2 20* 23 22 17* 22  GLUCOSE 339* 252* 158* 347* 333*  BUN 11 13 13  21* 28*  CREATININE 1.31* 0.83 0.82 0.87 0.88  CALCIUM 9.1 8.5* 8.9 9.1 9.3  MG  --   --   --  1.8 2.0   GFR: Estimated Creatinine Clearance: 98.6 mL/min (by C-G formula based on SCr of 0.88 mg/dL).  CBC: Recent Labs  Lab 10/05/18 1705 10/06/18 0707 10/07/18 0645  WBC 22.3* 18.6* 22.7*  NEUTROABS  --  17.0* 20.8*  HGB 13.4 12.4 11.7*  HCT 39.1 36.6 34.1*  MCV 80.0 80.3 79.5*  PLT 328 318 406*    Procedures:  None  Microbiology summarized: COVID-19 positive.  Assessment & Plan: Hypoxic respiratory failure: Multifactorial including acute CHF (unknown type), new A. fib with RVR,  RUL pneumonia.  COVID-19 positive but doubt active process this far out although she had some elevated inflammatory markers and LFT. -Continue IV Lasix 40 mg twice daily -Treat individual causes as below -Supplemental oxygen  Acute CHF (unknown type): Patient had acute hypoxemic respiratory failure after IV fluids  for DKA.  BNP markedly elevated.  CXR consistent with this.  Responded to IV Lasix well.  Excellent urine output with clinical improvement. -Continue IV Lasix twice daily -Daily weight, intake output and renal function. -Discussed with cardiology, Dr. Anne FuSkains. Holding off Echo to minimize staff exposure to COVID-19.  RUL pneumonia/hypoxic respiratory failure in patient with COVID-19 virus: Markedly elevated procalcitonin favors bacterial infection. -Continue aztreonam  and azithromycin-history of anaphylaxis with penicillin -Status post IV Solu-Medrol 125 mg once.  -No Remdesivir at this time-discussed with Dr. Kendrick Fries  New A. fib with RVR: Converted to NSR on Cardizem drip.  [place] vasc score 6 -Transition to oral metoprolol versus p.o. Cardizem given possible underlying CHF which could be systolic. -Continue Lovenox for anticoagulation  Elevated troponin: Likely demand ischemia due to hypoxia, fluid overload and A. Fib.  No acute ischemic finding on EKG.  Chest pain is very atypical. -Manage CHF and A. fib as above. -Metoprolol as above  History of CAD/MI?:  This history is vague.  Chest pain is musculoskeletal.  -Continue monitoring -Needs to be on a statin given history of diabetes.  Elevated liver enzymes/hyperbilirubinemia: Suspect congestive hepatopathy versus COVID-19.  Resolved.  Suboptimally controlled NIDDM-2/mild DKA: A1c 7.8%.  On insulin, metformin and [place] at home.  CBG elevated. -Hold home medications -Continue SSI-moderate -Increase Lantus to 15 units twice daily and add mealtime NovoLog at 8 units  Schizoaffective disorder: Stable.  Has not filled most of her psych medications recently per med rec but patient reports good compliance -Psychiatry consulted-recommended resuming Abilify, Trileptal and Zoloft -Follow further recommendations.  Hypertension: Normotensive -IV Lasix and metoprolol as above  History of asthma?  On Advair at home. [facility] and as needed  albuterol  DVT prophylaxis: Full dose Lovenox for A. fib Code Status: Full code Family Communication: Patient and/or RN. Available if any question.  Disposition Plan: Transfer to [facility] Consultants: None   Antimicrobials: Anti-infectives (From admission, onward)   Start     Dose/Rate Route Frequency Ordered Stop   10/06/18 1000  vancomycin (VANCOCIN) IVPB 750 mg/150 ml premix     750 mg 150 mL/hr over 60 Minutes Intravenous Every 12 hours 10/05/18 2038     10/05/18 2200  vancomycin (VANCOCIN) 2,000 mg in sodium chloride 0.9 % 500 mL IVPB     2,000 mg 250 mL/hr over 120 Minutes Intravenous  Once 10/05/18 2038 10/06/18 0044   10/05/18 2200  azithromycin (ZITHROMAX) 500 mg in sodium chloride 0.9 % 250 mL IVPB     500 mg 250 mL/hr over 60 Minutes Intravenous Every 24 hours 10/05/18 2107     10/05/18 2100  aztreonam (AZACTAM) 2 g in sodium chloride 0.9 % 100 mL IVPB     2 g 200 mL/hr over 30 Minutes Intravenous Every 8 hours 10/05/18 2038     10/05/18 1930  levofloxacin (LEVAQUIN) IVPB 750 mg  Status:  Discontinued     750 mg 100 mL/hr over 90 Minutes Intravenous  Once 10/05/18 1924 10/05/18 2040      Sch Meds:  Scheduled Meds: . ARIPiprazole  5 mg Oral Daily  . enoxaparin (LOVENOX) injection  120 mg Subcutaneous Q12H  . enoxaparin (LOVENOX) injection  90 mg Subcutaneous Once  . furosemide  40 mg Intravenous BID  . insulin aspart  0-15 Units Subcutaneous TID WC  . insulin aspart  0-5 Units Subcutaneous QHS  . insulin aspart  8 Units Subcutaneous TID WC  . insulin glargine  15 Units Subcutaneous BID  . mouth rinse  15 mL Mouth Rinse BID  . metoprolol tartrate  50 mg Oral BID  . mometasone-formoterol  2 puff Inhalation BID  . oxcarbazepine  1,200 mg Oral QHS  . sertraline  50 mg Oral Daily   Continuous Infusions: . azithromycin 500 mg (10/06/18 2122)  . aztreonam 2 g (10/07/18 0345)  . diltiazem (CARDIZEM) infusion 12.5 mg/hr (10/07/18 0849)  . vancomycin  750 mg (10/07/18  0816)   PRN Meds:.   T.  [facility]  If 7PM-7AM, please contact night-coverage [email or web address] Password [email or web address] 10/07/2018, 11:34 AM

## 2018-10-07 NOTE — Progress Notes (Signed)
Dr. Josephine Cables verbally acknowledged pt's lactic acid of 2.6.  MD also made aware of pt's chest pain and has put in an order for pain medication.

## 2018-10-07 NOTE — Progress Notes (Signed)
Daily Nursing Note  Received report from [place]. Introduced self to patient who verbalized feeling well.  VSS, Diltiazem gtt decreased to 5mg /hr and Oxygen decreased to 3LPM.Denies CP at the time of assessment. Did state that she felt her oxygen was not working, appears tube may have kinked. Patients daughter, Mrs. Mancel Bale called and updated. Patient gotten OOB and remained up in the chair for a period of time then gotten back in bed. Receiving a bath this morning. Report provided to Dorothy at [facility]. All patient needs me throughout the morning.

## 2018-10-07 NOTE — Progress Notes (Signed)
[facility] admitting made aware of patient arrival via text page

## 2018-10-08 DIAGNOSIS — J189 Pneumonia, unspecified organism: Secondary | ICD-10-CM

## 2018-10-08 LAB — GLUCOSE, CAPILLARY
Glucose-Capillary: 303 mg/dL — ABNORMAL HIGH (ref 70–99)
Glucose-Capillary: 393 mg/dL — ABNORMAL HIGH (ref 70–99)
Glucose-Capillary: 388 mg/dL — ABNORMAL HIGH (ref 70–99)
Glucose-Capillary: 330 mg/dL — ABNORMAL HIGH (ref 70–99)

## 2018-10-08 LAB — CBC WITH DIFFERENTIAL/PLATELET
Abs Immature Granulocytes: 0.17 10*3/uL — ABNORMAL HIGH (ref 0.00–0.07)
Basophils Absolute: 0 10*3/uL (ref 0.0–0.1)
Basophils Relative: 0 %
Eosinophils Absolute: 0 10*3/uL (ref 0.0–0.5)
Eosinophils Relative: 0 %
HCT: 33.8 % — ABNORMAL LOW (ref 36.0–46.0)
Hemoglobin: 11.3 g/dL — ABNORMAL LOW (ref 12.0–15.0)
Immature Granulocytes: 1 %
Lymphocytes Relative: 5 %
Lymphs Abs: 1.3 10*3/uL (ref 0.7–4.0)
MCH: 26.8 pg (ref 26.0–34.0)
MCHC: 33.4 g/dL (ref 30.0–36.0)
MCV: 80.1 fL (ref 80.0–100.0)
Monocytes Absolute: 0.4 10*3/uL (ref 0.1–1.0)
Monocytes Relative: 2 %
Neutro Abs: 22.4 10*3/uL — ABNORMAL HIGH (ref 1.7–7.7)
Neutrophils Relative %: 92 %
Platelets: 431 10*3/uL — ABNORMAL HIGH (ref 150–400)
RBC: 4.22 MIL/uL (ref 3.87–5.11)
RDW: 14.1 % (ref 11.5–15.5)
WBC: 24.4 10*3/uL — ABNORMAL HIGH (ref 4.0–10.5)
nRBC: 0 % (ref 0.0–0.2)

## 2018-10-08 LAB — COMPREHENSIVE METABOLIC PANEL
ALT: 25 U/L (ref 0–44)
AST: 12 U/L — ABNORMAL LOW (ref 15–41)
Albumin: 2.2 g/dL — ABNORMAL LOW (ref 3.5–5.0)
Alkaline Phosphatase: 144 U/L — ABNORMAL HIGH (ref 38–126)
Anion gap: 14 (ref 5–15)
BUN: 46 mg/dL — ABNORMAL HIGH (ref 6–20)
CO2: 22 mmol/L (ref 22–32)
Calcium: 8.6 mg/dL — ABNORMAL LOW (ref 8.9–10.3)
Chloride: 100 mmol/L (ref 98–111)
Creatinine, Ser: 0.95 mg/dL (ref 0.44–1.00)
GFR calc Af Amer: >60 mL/min (ref >60)
GFR calc non Af Amer: >60 mL/min (ref >60)
Glucose, Bld: 244 mg/dL — ABNORMAL HIGH (ref 70–99)
Potassium: 3.3 mmol/L — ABNORMAL LOW (ref 3.5–5.1)
Sodium: 136 mmol/L (ref 135–145)
Total Bilirubin: 0.8 mg/dL (ref 0.3–1.2)
Total Protein: 6.4 g/dL — ABNORMAL LOW (ref 6.5–8.1)

## 2018-10-08 LAB — D-DIMER, QUANTITATIVE: D-Dimer, Quant: 1.22 ug/mL-FEU — ABNORMAL HIGH (ref 0.00–0.50)

## 2018-10-08 LAB — CK: Total CK: 23 U/L — ABNORMAL LOW (ref 38–234)

## 2018-10-08 LAB — MRSA PCR SCREENING: MRSA by PCR: NEGATIVE

## 2018-10-08 LAB — INTERLEUKIN-6, PLASMA: Interleukin-6, Plasma: 1766.3 pg/mL — ABNORMAL HIGH (ref 0.0–12.2)

## 2018-10-08 LAB — FERRITIN: Ferritin: 547 ng/mL — ABNORMAL HIGH (ref 11–307)

## 2018-10-08 LAB — C-REACTIVE PROTEIN: CRP: 22.1 mg/dL — ABNORMAL HIGH (ref <1.0)

## 2018-10-08 MED ORDER — INSULIN GLARGINE 100 UNIT/ML ~~LOC~~ SOLN
20.0000 [IU] | Freq: Two times a day (BID) | SUBCUTANEOUS | Status: DC
  Administered 2018-10-08 – 2018-10-12 (×8): 20 [IU] via SUBCUTANEOUS
  Filled 2018-10-08 (×9): qty 0.2

## 2018-10-08 NOTE — Progress Notes (Signed)
Pharmacy Antibiotic Note  Lauren Cummings is a 58 y.o. female admitted on 10/05/2018 with chest pain, diaphoresis, hypotension, +Covid-19. Pharmacy is consulted for empiric antibiotics for pneumonia.    Plan: -Aztreonam 2 g IV q8h -Vancomycin 2 g IV x1 then 750 mg IV q12h - Azithromycin 500 mg IV q24h per MD -Monitor renal fx, cultures, vancomycin levels as needed   Height: 5' 9.5" (176.5 cm) Weight: 258 lb (117 kg) IBW/kg (Calculated) : 67.35  Temp (24hrs), Avg:98.4 F (36.9 C), Min:98.3 F (36.8 C), Max:98.5 F (36.9 C)  Recent Labs  Lab 10/05/18 1705 10/05/18 2020 10/05/18 2318 10/06/18 0707 10/06/18 1659 10/06/18 1827 10/07/18 0645 10/07/18 1830 10/08/18 0450  WBC 22.3*  --   --  18.6*  --   --  22.7*  --  24.4*  CREATININE 1.31*  --  0.83 0.82  --  0.87 0.88  --  0.95  LATICACIDVEN  --  2.1*  --   --  2.9*  --   --  2.6*  --     Estimated Creatinine Clearance: 91 mL/min (by C-G formula based on SCr of 0.95 mg/dL).    Allergies  Allergen Reactions  . Banana Anaphylaxis  . Coconut Flavor Anaphylaxis  . Keflex [Cephalexin] Anaphylaxis  . Penicillins Anaphylaxis    Did it involve swelling of the face/tongue/throat, SOB, or low BP? yes Did it involve sudden or severe rash/hives, skin peeling, or any reaction on the inside of your mouth or nose? no Did you need to seek medical attention at a hospital or doctor's office? yes When did it last happen?8 years ago If all above answers are "NO", may proceed with cephalosporin use.  Marland Kitchen Pineapple Anaphylaxis  . Other Hives    All berries cause hives    Antimicrobials this admission: 7/11 aztreonam > 7/11 vancomycin > 7/11 azithromycin >   Dose adjustments this admission: N/A  Microbiology results: 7/11 BCx: ngtd 7/11 Covid-19: + 7/12 Bcx:  ngtd 7/14 MRSA PCR:   Gretta Arab PharmD, BCPS Clinical pharmacist phone 7am- 5pm: [phone] 10/08/2018 3:17 PM

## 2018-10-08 NOTE — Progress Notes (Signed)
Inpatient Diabetes Program Recommendations  AACE/ADA: New Consensus Statement on Inpatient Glycemic Control (2015)  Target Ranges:  Prepandial:   less than 140 mg/dL      Peak postprandial:   less than 180 mg/dL (1-2 hours)      Critically ill patients:  140 - 180 mg/dL   Lab Results  Component Value Date   GLUCAP 303 (H) 10/08/2018   HGBA1C 7.8 (H) 10/06/2018    Review of Glycemic Control Results for LADAVIA, LINDENBAUM (MRN [id number]) as of 10/08/2018 12:13  Ref. Range 10/07/2018 07:43 10/07/2018 12:10 10/07/2018 16:01 10/07/2018 21:13 10/08/2018 08:23  Glucose-Capillary Latest Ref Range: 70 - 99 mg/dL 309 (H) 323 (H) 299 (H) 172 (H) 303 (H)   Diabetes history: Type 2 DM Outpatient Diabetes medications: Metformin 1000 mg BID, Lantus 20 units BID, Admelog 12 units TID Current orders for Inpatient glycemic control: Lantus 15 units BID, Novolog 0-15 unit TID, Novolog 0-5 units QHS  Inpatient Diabetes Program Recommendations:   -Increase Lantus to 20 units bid -Increase Novolog meal coverage to 10 units tid if eats 50% -Increase Novolog correction to resistant tid + hs 0-5 units  Thank you, Bethena Roys E. Erico Stan, RN, MSN, CDE  Diabetes Coordinator Inpatient Glycemic Control Team Team Pager [phone] (8am-5pm) 10/08/2018 12:15 PM

## 2018-10-08 NOTE — Progress Notes (Signed)
PROGRESS NOTE  Lauren Cummings EXB:[id number] DOB: 1961-02-24   PCP: Marcine Matar, MD  Patient is from: Home  DOA: 10/05/2018 LOS: 3  Brief Narrative / Interim history: 58 year old female with schizoaffective disorder, hypertension, CAD and recent COVID 19+ on 08/29/2018 presenting with left-sided chest pain and admitted for HCAP (RUL pneumonia), mild DKA and elevated liver enzymes. In ED, afebrile but hypotensive to 80/58.  Desaturated to 89% and started on nasal cannula.  COVID-19 positive.  WBC 22.3.  With bandemia and lymphopenia.  Sodium 131.  Bicarb 20.  Glucose 339.  Creatinine 1.31.  Anion gap 17.  ALP 196.  AST 80.  ALT 71.  Total bili 4.1.  BNP 600.  CK 45.  CK-MB 8.5.  LDH 200.  High-sensitivity troponin 13> 10.  Ferritin 414.  CRP 36.4.  Lactic acid 2.1.  Procalcitonin 6.37.  CTA RUL pneumonia.  Started on aztreonam and azithromycin for RUL pneumonia.  Started on IV fluid and insulin drip for mild DKA.  Assessment & Plan: Principal Problem:   HCAP (healthcare-associated pneumonia) Active Problems:   COVID-19 virus infection   Diabetes mellitus type 2, uncontrolled, without complications (HCC)   Essential hypertension   Bipolar disorder (HCC)   Abnormal liver function   Chest pain   AF (paroxysmal atrial fibrillation) (HCC)   Acute CHF (congestive heart failure) (HCC)  Acute hypoxic respiratory failure: Multifactorial including questionable heart failure/overload, new onset provoked A. fib with RVR,  RUL pneumonia with COVID-19 positive labs -See below for treatment  Volume overload, cannot rule out heart failure:  Patient hypoxic status post aggressive IV fluids for DKA.  BNP markedly elevated.  Responded to IV Lasix well.  Excellent urine output with clinical improvement. Lasix now on hold given patient's lactic acidosis as below Cardiology previously following Dr. Anne Fu. Holding off Echo to minimize staff exposure to COVID-19 Will need outpatient echo per  cardiology  RUL pneumonia/hypoxic respiratory failure in patient with COVID-19 virus:  Likely bacterial given an elevated procalcitonin Continue aztreonam and azithromycin-history of anaphylaxis with penicillin Continue steroids No Remdesivir at this time given covid positive testing >30 days ago - unlikely involved in this acute process Recent Labs    10/05/18 2020 10/07/18 0645 10/08/18 0450  DDIMER  --  3.13* 1.22*  FERRITIN 414* 615* 547*  LDH 200* 161  --   CRP 36.4*  --  22.1*   Lab Results  Component Value Date   SARSCOV2NAA POSITIVE (A) 10/05/2018   SARSCOV2NAA POSITIVE (A) 08/29/2018    Newly diagnosed, likely provoked, A. fib with RVR: Now sinus rhythm  -Patient now on Cardizem drip; [place] vasc score 6 -Continue p.o. metoprolol -remains rate controlled -Continue Lovenox for anticoagulation -likely de-escalate given provoked A. fib which is now resolved, would likely need outpatient cardiology follow-up as above repeat EKG and possible Holter monitor to ensure no ongoing A. fib  Elevated troponin: Likely demand ischemia  -Secondary due to hypoxia, fluid overload and a-fib. No acute ischemic finding on EKG.   -Right shoulder/chest pain is very atypical -appears to be pleuritic/musculoskeletal in nature -Troponin unremarkable -Metoprolol as above  Questionable history of CAD/MI?  -Follow with outpatient cardiology as above  Elevated liver enzymes/hyperbilirubinemia:  Likely in the setting of congestion/volume overload versus recent COVID-19 infection.  NIDDM-2, markedly uncontrolled, rule out DKA: POA, resolved  -A1c 7.8%.  On insulin, metformin and Steglarto at home.  CBG elevated. -Hold home medications -Continue SSI-moderate -Increase Lantus to 20 units twice daily and continue mealtime  NovoLog at 8 units  Schizoaffective disorder: Stable.   -Has not filled most of her psych medications recently per med rec but patient reports good compliance -Psychiatry  consulted-recommended resuming Abilify, Trileptal and Zoloft -Follow further recommendations.  Hypertension: Controlled  -IV Lasix and metoprolol as above  History of asthma?   -On Advair at home. [facility]-Dulera and as needed albuterol  DVT prophylaxis: Full dose Lovenox for A. fib Code Status: Full code Family Communication: Patient and/or RN. Available if any question.  Disposition Plan: Remains inpatient, likely discharge home in the next 24 to 48 hours pending clinical course and resolution of hypoxia Consultants: None  Subjective: No acute issues or events overnight, patient's Cardizem was able to be weaned off yesterday, rate now controlled, remains on 2 L nasal cannula this morning but symptoms appear to be much improved from previous.  Patient states her previous right shoulder pain is all but resolved.  Declines nausea, vomiting, diarrhea, constipation, headache, fevers, chills..  [facility]  She says she was there last month.   Objective: Vitals:   10/08/18 0000 10/08/18 0241 10/08/18 0400 10/08/18 0415  BP: (!) 93/58   91/62  Pulse: 84   80  Resp: (!) 30   18  Temp: 98.4 F (36.9 C)   98.5 F (36.9 C)  TempSrc:      SpO2: 94%  93% 93%  Weight:  117 kg    Height:        Intake/Output Summary (Last 24 hours) at 10/08/2018 0813 Last data filed at 10/07/2018 2000 Gross per 24 hour  Intake 410.39 ml  Output 2050 ml  Net -1639.61 ml   Filed Weights   10/06/18 0100 10/07/18 0330 10/08/18 0241  Weight: 120.3 kg 117.7 kg 117 kg    Examination:  General:  Pleasantly resting in bed, No acute distress. HEENT:  Normocephalic atraumatic.  Sclerae nonicteric, noninjected.  Extraocular movements intact bilaterally. Neck:  Without mass or deformity.  Trachea is midline. Lungs:  Clear to auscultate bilaterally without rhonchi, wheeze, or rales. Heart:  Regular rate and rhythm.  Without murmurs, rubs, or gallops. Abdomen:  Soft, nontender, nondistended.   Without guarding or rebound. Extremities: Without cyanosis, clubbing, edema, or obvious deformity. Vascular:  Dorsalis pedis and posterior tibial pulses palpable bilaterally. Skin:  Warm and dry, no erythema, no ulcerations.  I have personally reviewed the following labs and images:  Radiology Studies: No results found.  Microbiology: Recent Results (from the past 240 hour(s))  SARS Coronavirus 2 (CEPHEID - [facility]), Hosp Order     Status: Abnormal   Collection Time: 10/05/18  5:40 PM   Specimen: Nasopharyngeal Swab  Result Value Ref Range Status   SARS Coronavirus 2 POSITIVE (A) NEGATIVE Final    Comment: RESULT CALLED TO, READ BACK BY AND VERIFIED WITH: RN Pattricia Boss KENNEDY [phone] (NOTE) If result is NEGATIVE SARS-CoV-2 target nucleic acids are NOT DETECTED. The SARS-CoV-2 RNA is generally detectable in upper and lower  respiratory specimens during the acute phase of infection. The lowest  concentration of SARS-CoV-2 viral copies this assay can detect is 250  copies / mL. A negative result does not preclude SARS-CoV-2 infection  and should not be used as the sole basis for treatment or other  patient management decisions.  A negative result may occur with  improper specimen collection / handling, submission of specimen other  than nasopharyngeal swab, presence of viral mutation(s) within the  areas targeted  by this assay, and inadequate number of viral copies  (<250 copies / mL). A negative result must be combined with clinical  observations, patient history, and epidemiological information. If result is POSITIVE SARS-CoV-2 target nucleic acids are DETECTED. The SARS -CoV-2 RNA is generally detectable in upper and lower  respiratory specimens during the acute phase of infection.  Positive  results are indicative of active infection with SARS-CoV-2.  Clinical  correlation with patient history and other diagnostic information is  necessary to  determine patient infection status.  Positive results do  not rule out bacterial infection or co-infection with other viruses. If result is PRESUMPTIVE POSTIVE SARS-CoV-2 nucleic acids MAY BE PRESENT.   A presumptive positive result was obtained on the submitted specimen  and confirmed on repeat testing.  While 2019 novel coronavirus  (SARS-CoV-2) nucleic acids may be present in the submitted sample  additional confirmatory testing may be necessary for epidemiological  and / or clinical management purposes  to differentiate between  SARS-CoV-2 and other Sarbecovirus currently known to infect humans.  If clinically indicated additional testing with an alternate test  methodology [id number]) is advise d. The SARS-CoV-2 RNA is generally  detectable in upper and lower respiratory specimens during the acute  phase of infection. The expected result is Negative. Fact Sheet for Patients:  StrictlyIdeas.no Fact Sheet for Healthcare Providers: BankingDealers.co.za This test is not yet approved or cleared by the [place] FDA and has been authorized for detection and/or diagnosis of SARS-CoV-2 by FDA under an Emergency Use Authorization (EUA).  This EUA will remain in effect (meaning this test can be used) for the duration of the COVID-19 declaration under Section 564(b)(1) of the Act, 21 U.S.C. section 360bbb-3(b)(1), unless the authorization is terminated or revoked sooner. Performed at [facility], [place] [address] [place], [place] [address]   Culture, blood (routine x 2)     Status: None (Preliminary result)   Collection Time: 10/05/18  8:21 PM   Specimen: BLOOD  Result Value Ref Range Status   Specimen Description BLOOD RIGHT ANTECUBITAL  Final   Special Requests   Final    BOTTLES DRAWN AEROBIC AND ANAEROBIC Blood Culture results may not be optimal due to an excessive volume of blood received in culture bottles   Culture   Final     NO GROWTH 2 DAYS Performed at [facility], [place] [address], [place], [place] [address]    Report Status PENDING  Incomplete  Culture, blood (routine x 2)     Status: None (Preliminary result)   Collection Time: 10/06/18  7:20 AM   Specimen: BLOOD  Result Value Ref Range Status   Specimen Description BLOOD LEFT ANTECUBITAL  Final   Special Requests   Final    BOTTLES DRAWN AEROBIC ONLY Blood Culture results may not be optimal due to an inadequate volume of blood received in culture bottles   Culture   Final    NO GROWTH 1 DAY Performed at [facility], [place] [address] [place],  [address]    Report Status PENDING  Incomplete    Sepsis Labs: Invalid input(s): PROCALCITONIN, LACTICIDVEN  Urine analysis: No results found for: COLORURINE, APPEARANCEUR, LABSPEC, PHURINE, GLUCOSEU, HGBUR, BILIRUBINUR, KETONESUR, PROTEINUR, UROBILINOGEN, NITRITE, LEUKOCYTESUR  Anemia Panel: Recent Labs    10/07/18 0645 10/08/18 0450  FERRITIN 615* 547*    Thyroid Function Tests: Recent Labs    10/05/18 2020 10/06/18 1659  TSH 1.536  --   FREET4  --  1.47*    Lipid Profile: No results for input(s): CHOL, HDL, LDLCALC, TRIG, CHOLHDL, LDLDIRECT in the last 72 hours.  CBG: Recent Labs  Lab 10/06/18 2110 10/07/18 0743 10/07/18 1210 10/07/18 1601 10/07/18 2113  GLUCAP 399* 309* 323* 299* 172*    HbA1C: Recent Labs    10/06/18 0707  HGBA1C 7.8*    BNP (last 3 results): No results for input(s): PROBNP in the last 8760 hours.  Cardiac Enzymes: Recent Labs  Lab 10/05/18 2020 10/06/18 0707 10/07/18 0645 10/08/18 0450  CKTOTAL 45 44 46 23*  CKMB 8.5*  --   --   --     Coagulation Profile: No results for input(s): INR, PROTIME in the last 168 hours.  Liver Function Tests: Recent Labs  Lab 10/05/18 1705 10/06/18 0707 10/06/18 1255 10/07/18 0645 10/08/18 0450  AST 80* 36  --  16 12*  ALT 71* 50*  --  36 25  ALKPHOS 196* 169*  --  152* 144*  BILITOT  4.1* 3.2* 2.9* 1.5* 0.8  PROT 7.7 7.0  --  6.7 6.4*  ALBUMIN 2.9* 2.4*  --  2.1* 2.2*   No results for input(s): LIPASE, AMYLASE in the last 168 hours. No results for input(s): AMMONIA in the last 168 hours.  Basic Metabolic Panel: Recent Labs  Lab 10/05/18 2318 10/06/18 0707 10/06/18 1827 10/07/18 0645 10/08/18 0450  NA 133* 135 133* 135 136  K 3.6 3.6 4.1 3.7 3.3*  CL 101 101 102 100 100  CO2 23 22 17* 22 22  GLUCOSE 252* 158* 347* 333* 244*  BUN 13 13 21* 28* 46*  CREATININE 0.83 0.82 0.87 0.88 0.95  CALCIUM 8.5* 8.9 9.1 9.3 8.6*  MG  --   --  1.8 2.0  --    GFR: Estimated Creatinine Clearance: 91 mL/min (by C-G formula based on SCr of 0.95 mg/dL).  CBC: Recent Labs  Lab 10/05/18 1705 10/06/18 0707 10/07/18 0645 10/08/18 0450  WBC 22.3* 18.6* 22.7* 24.4*  NEUTROABS  --  17.0* 20.8* 22.4*  HGB 13.4 12.4 11.7* 11.3*  HCT 39.1 36.6 34.1* 33.8*  MCV 80.0 80.3 79.5* 80.1  PLT 328 318 406* 431*    Procedures:  None  Microbiology summarized: COVID-19 positive.  Antimicrobials: Anti-infectives (From admission, onward)   Start     Dose/Rate Route Frequency Ordered Stop   10/06/18 1000  vancomycin (VANCOCIN) IVPB 750 mg/150 ml premix     750 mg 150 mL/hr over 60 Minutes Intravenous Every 12 hours 10/05/18 2038     10/05/18 2200  vancomycin (VANCOCIN) 2,000 mg in sodium chloride 0.9 % 500 mL IVPB     2,000 mg 250 mL/hr over 120 Minutes Intravenous  Once 10/05/18 2038 10/06/18 0044   10/05/18 2200  azithromycin (ZITHROMAX) 500 mg in sodium chloride 0.9 % 250 mL IVPB     500 mg 250 mL/hr over 60 Minutes Intravenous Every 24 hours 10/05/18 2107 10/10/18 2159   10/05/18 2100  aztreonam (AZACTAM) 2 g in sodium chloride 0.9 % 100 mL IVPB     2 g 200 mL/hr over 30 Minutes Intravenous Every 8 hours 10/05/18 2038 10/10/18 2159   10/05/18 1930  levofloxacin (LEVAQUIN) IVPB 750 mg  Status:  Discontinued     750 mg 100 mL/hr over 90 Minutes Intravenous  Once 10/05/18  1924 10/05/18 2040      Sch Meds:  Scheduled Meds: . ARIPiprazole  5 mg Oral Daily  . enoxaparin (LOVENOX) injection  120 mg Subcutaneous  Q12H  . insulin aspart  0-15 Units Subcutaneous TID WC  . insulin aspart  0-5 Units Subcutaneous QHS  . insulin aspart  8 Units Subcutaneous TID WC  . insulin glargine  15 Units Subcutaneous BID  . mouth rinse  15 mL Mouth Rinse BID  . methylPREDNISolone (SOLU-MEDROL) injection  60 mg Intravenous Q4H  . [START ON 10/09/2018] methylPREDNISolone (SOLU-MEDROL) injection  40 mg Intravenous Q6H  . metoprolol tartrate  50 mg Oral BID  . mometasone-formoterol  2 puff Inhalation BID  . oxcarbazepine  1,200 mg Oral QHS  . sertraline  50 mg Oral Daily   Continuous Infusions: . azithromycin Stopped (10/08/18 0108)  . aztreonam 2 g (10/08/18 0612)  . diltiazem (CARDIZEM) infusion Stopped (10/07/18 1231)  . vancomycin Stopped (10/08/18 0033)   PRN Meds:.  Carma LeavenWilliam  [facility]  If 7PM-7AM, please contact night-coverage [email or web address] Password [email or web address] 10/08/2018, 8:13 AM

## 2018-10-09 LAB — CBC WITH DIFFERENTIAL/PLATELET
Abs Immature Granulocytes: 0.18 10*3/uL — ABNORMAL HIGH (ref 0.00–0.07)
Basophils Absolute: 0 10*3/uL (ref 0.0–0.1)
Basophils Relative: 0 %
Eosinophils Absolute: 0 10*3/uL (ref 0.0–0.5)
Eosinophils Relative: 0 %
HCT: 34.1 % — ABNORMAL LOW (ref 36.0–46.0)
Hemoglobin: 11.4 g/dL — ABNORMAL LOW (ref 12.0–15.0)
Immature Granulocytes: 1 %
Lymphocytes Relative: 7 %
Lymphs Abs: 1.3 10*3/uL (ref 0.7–4.0)
MCH: 26.8 pg (ref 26.0–34.0)
MCHC: 33.4 g/dL (ref 30.0–36.0)
MCV: 80.2 fL (ref 80.0–100.0)
Monocytes Absolute: 0.6 10*3/uL (ref 0.1–1.0)
Monocytes Relative: 3 %
Neutro Abs: 16.1 10*3/uL — ABNORMAL HIGH (ref 1.7–7.7)
Neutrophils Relative %: 89 %
Platelets: 498 10*3/uL — ABNORMAL HIGH (ref 150–400)
RBC: 4.25 MIL/uL (ref 3.87–5.11)
RDW: 14.4 % (ref 11.5–15.5)
WBC: 18.3 10*3/uL — ABNORMAL HIGH (ref 4.0–10.5)
nRBC: 0 % (ref 0.0–0.2)

## 2018-10-09 LAB — COMPREHENSIVE METABOLIC PANEL
ALT: 21 U/L (ref 0–44)
AST: 12 U/L — ABNORMAL LOW (ref 15–41)
Albumin: 2.2 g/dL — ABNORMAL LOW (ref 3.5–5.0)
Alkaline Phosphatase: 121 U/L (ref 38–126)
Anion gap: 12 (ref 5–15)
BUN: 48 mg/dL — ABNORMAL HIGH (ref 6–20)
CO2: 22 mmol/L (ref 22–32)
Calcium: 8.8 mg/dL — ABNORMAL LOW (ref 8.9–10.3)
Chloride: 100 mmol/L (ref 98–111)
Creatinine, Ser: 0.79 mg/dL (ref 0.44–1.00)
GFR calc Af Amer: >60 mL/min (ref >60)
GFR calc non Af Amer: >60 mL/min (ref >60)
Glucose, Bld: 229 mg/dL — ABNORMAL HIGH (ref 70–99)
Potassium: 3.6 mmol/L (ref 3.5–5.1)
Sodium: 134 mmol/L — ABNORMAL LOW (ref 135–145)
Total Bilirubin: 0.5 mg/dL (ref 0.3–1.2)
Total Protein: 6.1 g/dL — ABNORMAL LOW (ref 6.5–8.1)

## 2018-10-09 LAB — FERRITIN: Ferritin: 263 ng/mL (ref 11–307)

## 2018-10-09 LAB — CK: Total CK: 11 U/L — ABNORMAL LOW (ref 38–234)

## 2018-10-09 LAB — C-REACTIVE PROTEIN: CRP: 12.2 mg/dL — ABNORMAL HIGH (ref <1.0)

## 2018-10-09 LAB — GLUCOSE, CAPILLARY
Glucose-Capillary: 243 mg/dL — ABNORMAL HIGH (ref 70–99)
Glucose-Capillary: 338 mg/dL — ABNORMAL HIGH (ref 70–99)
Glucose-Capillary: 313 mg/dL — ABNORMAL HIGH (ref 70–99)
Glucose-Capillary: 228 mg/dL — ABNORMAL HIGH (ref 70–99)

## 2018-10-09 LAB — D-DIMER, QUANTITATIVE: D-Dimer, Quant: 0.99 ug/mL-FEU — ABNORMAL HIGH (ref 0.00–0.50)

## 2018-10-09 MED ORDER — DILTIAZEM HCL 30 MG PO TABS
30.0000 mg | ORAL_TABLET | Freq: Four times a day (QID) | ORAL | Status: DC
  Administered 2018-10-09 – 2018-10-12 (×11): 30 mg via ORAL
  Filled 2018-10-09 (×16): qty 1

## 2018-10-09 MED ORDER — INSULIN ASPART 100 UNIT/ML ~~LOC~~ SOLN
0.0000 [IU] | Freq: Three times a day (TID) | SUBCUTANEOUS | Status: DC
  Administered 2018-10-09: 16:00:00 15 [IU] via SUBCUTANEOUS
  Administered 2018-10-10: 4 [IU] via SUBCUTANEOUS
  Administered 2018-10-10: 15 [IU] via SUBCUTANEOUS
  Administered 2018-10-10 – 2018-10-11 (×3): 11 [IU] via SUBCUTANEOUS
  Administered 2018-10-11: 7 [IU] via SUBCUTANEOUS
  Administered 2018-10-12 (×2): 11 [IU] via SUBCUTANEOUS

## 2018-10-09 MED ORDER — DILTIAZEM HCL 30 MG PO TABS
30.0000 mg | ORAL_TABLET | Freq: Once | ORAL | Status: AC
  Administered 2018-10-09: 30 mg via ORAL
  Filled 2018-10-09: qty 1

## 2018-10-09 MED ORDER — DILTIAZEM HCL ER 60 MG PO CP12
60.0000 mg | ORAL_CAPSULE | Freq: Two times a day (BID) | ORAL | Status: DC
  Filled 2018-10-09: qty 1

## 2018-10-09 NOTE — Progress Notes (Signed)
PROGRESS NOTE  Lauren Cummings WUJ:[id number] DOB: 1960-06-19   PCP: Marcine MatarJohnson, Deborah B, MD  Patient is from: Home  DOA: 10/05/2018 LOS: 4  Brief Narrative / Interim history: 58 year old female with schizoaffective disorder, hypertension, CAD and recent COVID 19+ on 08/29/2018 presenting with left-sided chest pain and admitted for HCAP (RUL pneumonia), mild DKA and elevated liver enzymes. In ED, afebrile but hypotensive to 80/58.  Desaturated to 89% and started on nasal cannula.  COVID-19 positive.  WBC 22.3.  With bandemia and lymphopenia.  Sodium 131.  Bicarb 20.  Glucose 339.  Creatinine 1.31.  Anion gap 17. ALP 196. AST 80. ALT 71. Total bili 4.1. BNP 600. CK 45. CK-MB 8.5. LDH 200.  High-sensitivity troponin 13> 10.  Ferritin 414.  CRP 36.4.  Lactic acid 2.1.  Procalcitonin 6.37.  CTA RUL pneumonia. Started on aztreonam and azithromycin for RUL pneumonia.  Started on IV fluid and insulin drip for mild DKA.  Assessment & Plan: Principal Problem:   HCAP (healthcare-associated pneumonia) Active Problems:   COVID-19 virus infection   Diabetes mellitus type 2, uncontrolled, without complications (HCC)   Essential hypertension   Bipolar disorder (HCC)   Abnormal liver function   Chest pain   AF (paroxysmal atrial fibrillation) (HCC)   Acute CHF (congestive heart failure) (HCC)   Acute hypoxic respiratory failure: Multifactorial including questionable heart failure/overload, new onset provoked A. fib with RVR,  RUL pneumonia with COVID-19 positive labs, improving -See below for treatment  Volume overload, cannot rule out heart failure, no echo on file  Patient hypoxic status post aggressive IV fluids for DKA with subsequent volume overload BNP markedly elevated previously (1500, up-trending from 600 after fluids administered) Responded to IV Lasix well. Excellent urine output with clinical improvement. Lasix now on hold given patient's lactic acidosis as below Cardiology previously  following: per Dr. Anne FuSkains - Holding off Echo to minimize staff exposure to COVID-19 Will need outpatient echo per cardiology  RUL pneumonia/hypoxic respiratory failure in patient with COVID-19 virus, improving Likely bacterial given moderately elevated procalcitonin Continue aztreonam and azithromycin-history of anaphylaxis with penicillin - stop date 10/09/18 Continue steroids - begin taper given improvement in symptoms No Remdesivir at this time given covid positive testing >30 days ago - unlikely involved in this acute process Recent Labs    10/07/18 0645 10/08/18 0450 10/09/18 0530  DDIMER 3.13* 1.22* 0.99*  FERRITIN 615* 547* 263  LDH 161  --   --   CRP  --  22.1* 12.2*   Lab Results  Component Value Date   SARSCOV2NAA POSITIVE (A) 10/05/2018   SARSCOV2NAA POSITIVE (A) 08/29/2018    Newly diagnosed, likely provoked, A. fib with RVR: back in a-fib today  -Patient now off Cardizem drip; [place] score 6 -Continue p.o. metoprolol - add diltiazem given a-fib with borderline rate at 100 overnight Goal heart rate <110 at rest; <130 with exertion -Continue Lovenox for anticoagulation -likely transition to PO anticoagulation at DC  Elevated troponin: Likely demand ischemia, resolving -Secondary due to hypoxia, fluid overload and a-fib. No acute ischemic finding on EKG.   -Right shoulder/chest pain is very atypical -appears to be pleuritic/musculoskeletal in nature -Troponin subsequently unremarkable -Metoprolol as above  Questionable history of CAD/MI?  -Follow with outpatient cardiology as above  Elevated liver enzymes/hyperbilirubinemia:  Likely in the setting of congestion/volume overload versus recent COVID-19 infection.  NIDDM-2, markedly uncontrolled, rule out DKA: POA, resolved  -A1c 7.8%. On insulin, metformin and [place] at home. CBG elevated. -Hold home  medications -Continue SSI- increase to resistant/obese dose sliding -Continue Lantus to 20 units twice daily  and continue mealtime NovoLog at 8 units  Schizoaffective disorder, Stable.   -Has not filled most of her psych medications recently per med rec but patient reports good compliance -Psychiatry consulted-recommended resuming Abilify, Trileptal and Zoloft -Follow further recommendations.  Hypertension: Controlled  -IV Lasix and metoprolol as above  History of asthma?   -On Advair at home. [facility] and as needed albuterol  DVT prophylaxis: Full dose Lovenox for A. fib Code Status: Full code Family Communication: Patient and/or RN. Available if any question.  Disposition Plan: Remains inpatient, likely discharge home in the next 24 to 48 hours pending clinical course and resolution of hypoxia Consultants: None  Subjective: No acute issues or events overnight, patient's heart rate somewhat elevated early this morning, remains on 2 L nasal cannula this morning but symptoms appear to be much improved from previous.  Patient states her previous right shoulder pain is all but resolved.  Declines nausea, vomiting, diarrhea, constipation, headache, fevers, chills..  Patient [facility].  She says she was there last month.   Objective: Vitals:   10/09/18 0200 10/09/18 0400 10/09/18 0415 10/09/18 0420  BP:  106/77    Pulse: 87 (!) 56 (!) 105   Resp: (!) Temp:   98.1 F (36.7 C)   TempSrc:      SpO2: 91% 94% 92%   Weight:    118 kg  Height:        Intake/Output Summary (Last 24 hours) at 10/09/2018 0816 Last data filed at 10/09/2018 0200 Gross per 24 hour  Intake 300 ml  Output 500 ml  Net -200 ml   Filed Weights   10/07/18 0330 10/08/18 0241 10/09/18 0420  Weight: 117.7 kg 117 kg 118 kg    Examination:  General:  Pleasantly resting in bed, No acute distress. HEENT:  Normocephalic atraumatic.  Sclerae nonicteric, noninjected.  Extraocular movements intact bilaterally. Neck:  Without mass or deformity.  Trachea is midline. Lungs:  Clear to auscultate  bilaterally without rhonchi, wheeze, or rales. Heart:  Regular rate and rhythm.  Without murmurs, rubs, or gallops. Abdomen:  Soft, nontender, nondistended.  Without guarding or rebound. Extremities: Without cyanosis, clubbing, edema, or obvious deformity. Vascular:  Dorsalis pedis and posterior tibial pulses palpable bilaterally. Skin:  Warm and dry, no erythema, no ulcerations.  I have personally reviewed the following labs and images:  Radiology Studies: No results found.  Microbiology: Recent Results (from the past 240 hour(s))  SARS Coronavirus 2 (CEPHEID - Performed in [facility] hospital lab), Hosp Order     Status: Abnormal   Collection Time: 10/05/18  5:40 PM   Specimen: Nasopharyngeal Swab  Result Value Ref Range Status   SARS Coronavirus 2 POSITIVE (A) NEGATIVE Final    Comment: RESULT CALLED TO, READ BACK BY AND VERIFIED WITH: RN Pattricia Boss [phone] [id number] MLM (NOTE) If result is NEGATIVE SARS-CoV-2 target nucleic acids are NOT DETECTED. The SARS-CoV-2 RNA is generally detectable in upper and lower  respiratory specimens during the acute phase of infection. The lowest  concentration of SARS-CoV-2 viral copies this assay can detect is 250  copies / mL. A negative result does not preclude SARS-CoV-2 infection  and should not be used as the sole basis for treatment or other  patient management decisions.  A negative result may occur with  improper specimen collection / handling, submission of specimen other  than nasopharyngeal swab, presence of viral mutation(s) within the  areas targeted by this assay, and inadequate number of viral copies  (<250 copies / mL). A negative result must be combined with clinical  observations, patient history, and epidemiological information. If result is POSITIVE SARS-CoV-2 target nucleic acids are DETECTED. The SARS -CoV-2 RNA is generally detectable in upper and lower  respiratory specimens during the acute phase of infection.  Positive   results are indicative of active infection with SARS-CoV-2.  Clinical  correlation with patient history and other diagnostic information is  necessary to determine patient infection status.  Positive results do  not rule out bacterial infection or co-infection with other viruses. If result is PRESUMPTIVE POSTIVE SARS-CoV-2 nucleic acids MAY BE PRESENT.   A presumptive positive result was obtained on the submitted specimen  and confirmed on repeat testing.  While 2019 novel coronavirus  (SARS-CoV-2) nucleic acids may be present in the submitted sample  additional confirmatory testing may be necessary for epidemiological  and / or clinical management purposes  to differentiate between  SARS-CoV-2 and other Sarbecovirus currently known to infect humans.  If clinically indicated additional testing with an alternate test  methodology [phone]([id number]) is advise d. The SARS-CoV-2 RNA is generally  detectable in upper and lower respiratory specimens during the acute  phase of infection. The expected result is Negative. Fact Sheet for Patients:  BoilerBrush.com.cyhttps://www.fda.gov/media/136312/download Fact Sheet for Healthcare Providers: https://pope.com/https://www.fda.gov/media/136313/download This test is not yet approved or cleared by the [place] FDA and has been authorized for detection and/or diagnosis of SARS-CoV-2 by FDA under an Emergency Use Authorization (EUA).  This EUA will remain in effect (meaning this test can be used) for the duration of the COVID-19 declaration under Section 564(b)(1) of the Act, 21 U.S.C. section 360bbb-3(b)(1), unless the authorization is terminated or revoked sooner. Performed at [facility], [address] [place], [place] [address]   Culture, blood (routine x 2)     Status: None (Preliminary result)   Collection Time: 10/05/18  8:21 PM   Specimen: BLOOD  Result Value Ref Range Status   Specimen Description BLOOD RIGHT ANTECUBITAL  Final   Special Requests   Final     BOTTLES DRAWN AEROBIC AND ANAEROBIC Blood Culture results may not be optimal due to an excessive volume of blood received in culture bottles   Culture   Final    NO GROWTH 3 DAYS Performed at [facility], [address] [place], [place] [address]    Report Status PENDING  Incomplete  Culture, blood (routine x 2)     Status: None (Preliminary result)   Collection Time: 10/06/18  7:20 AM   Specimen: BLOOD  Result Value Ref Range Status   Specimen Description BLOOD LEFT ANTECUBITAL  Final   Special Requests   Final    BOTTLES DRAWN AEROBIC ONLY Blood Culture results may not be optimal due to an inadequate volume of blood received in culture bottles   Culture   Final    NO GROWTH 2 DAYS Performed at [facility], [address] [place], [place] [address]    Report Status PENDING  Incomplete  MRSA PCR Screening     Status: None   Collection Time: 10/08/18  6:07 PM   Specimen: Nasal Mucosa; Nasopharyngeal  Result Value Ref Range Status   MRSA by PCR NEGATIVE NEGATIVE Final    Comment:        The GeneXpert MRSA Assay (FDA approved for  NASAL specimens only), is one component of a comprehensive MRSA colonization surveillance program. It is not intended to diagnose MRSA infection nor to guide or monitor treatment for MRSA infections. Performed at [facility], [address] [place], [place] [address]     Sepsis Labs: Invalid input(s): PROCALCITONIN, LACTICIDVEN  Urine analysis: No results found for: COLORURINE, APPEARANCEUR, LABSPEC, PHURINE, GLUCOSEU, HGBUR, BILIRUBINUR, KETONESUR, PROTEINUR, UROBILINOGEN, NITRITE, LEUKOCYTESUR  Anemia Panel: Recent Labs    10/08/18 0450 10/09/18 0530  FERRITIN 547* 263    Thyroid Function Tests: Recent Labs    10/06/18 1659  FREET4 1.47*    Lipid Profile: No results for input(s): CHOL, HDL, LDLCALC, TRIG, CHOLHDL, LDLDIRECT in the last 72 hours.  CBG: Recent Labs  Lab 10/07/18 2113 10/08/18  0823 10/08/18 1204 10/08/18 1639 10/08/18 2024  GLUCAP 172* 303* 393* 388* 330*    HbA1C: No results for input(s): HGBA1C in the last 72 hours.  BNP (last 3 results): No results for input(s): PROBNP in the last 8760 hours.  Cardiac Enzymes: Recent Labs  Lab 10/05/18 2020 10/06/18 0707 10/07/18 0645 10/08/18 0450 10/09/18 0530  CKTOTAL 45 44 46 23* 11*  CKMB 8.5*  --   --   --   --     Coagulation Profile: No results for input(s): INR, PROTIME in the last 168 hours.  Liver Function Tests: Recent Labs  Lab 10/05/18 1705 10/06/18 0707 10/06/18 1255 10/07/18 0645 10/08/18 0450 10/09/18 0530  AST 80* 36  --  16 12* 12*  ALT 71* 50*  --  36 25 21  ALKPHOS 196* 169*  --  152* 144* 121  BILITOT 4.1* 3.2* 2.9* 1.5* 0.8 0.5  PROT 7.7 7.0  --  6.7 6.4* 6.1*  ALBUMIN 2.9* 2.4*  --  2.1* 2.2* 2.2*   No results for input(s): LIPASE, AMYLASE in the last 168 hours. No results for input(s): AMMONIA in the last 168 hours.  Basic Metabolic Panel: Recent Labs  Lab 10/06/18 0707 10/06/18 1827 10/07/18 0645 10/08/18 0450 10/09/18 0530  NA 135 133* 135 136 134*  K 3.6 4.1 3.7 3.3* 3.6  CL 101 102 100 100 100  CO2 22 17* 22 22 22   GLUCOSE 158* 347* 333* 244* 229*  BUN 13 21* 28* 46* 48*  CREATININE 0.82 0.87 0.88 0.95 0.79  CALCIUM 8.9 9.1 9.3 8.6* 8.8*  MG  --  1.8 2.0  --   --    GFR: Estimated Creatinine Clearance: 108.6 mL/min (by C-G formula based on SCr of 0.79 mg/dL).  CBC: Recent Labs  Lab 10/05/18 1705 10/06/18 0707 10/07/18 0645 10/08/18 0450 10/09/18 0530  WBC 22.3* 18.6* 22.7* 24.4* 18.3*  NEUTROABS  --  17.0* 20.8* 22.4* 16.1*  HGB 13.4 12.4 11.7* 11.3* 11.4*  HCT 39.1 36.6 34.1* 33.8* 34.1*  MCV 80.0 80.3 79.5* 80.1 80.2  PLT 328 318 406* 431* 498*    Procedures:  None  Microbiology summarized: COVID-19 positive.  Antimicrobials: Anti-infectives (From admission, onward)   Start     Dose/Rate Route Frequency Ordered Stop   10/06/18  1000  vancomycin (VANCOCIN) IVPB 750 mg/150 ml premix     750 mg 150 mL/hr over 60 Minutes Intravenous Every 12 hours 10/05/18 2038     10/05/18 2200  vancomycin (VANCOCIN) 2,000 mg in sodium chloride 0.9 % 500 mL IVPB     2,000 mg 250 mL/hr over 120 Minutes Intravenous  Once 10/05/18 2038 10/06/18 0044   10/05/18 2200  azithromycin (ZITHROMAX)  500 mg in sodium chloride 0.9 % 250 mL IVPB     500 mg 250 mL/hr over 60 Minutes Intravenous Every 24 hours 10/05/18 2107 10/10/18 2159   10/05/18 2100  aztreonam (AZACTAM) 2 g in sodium chloride 0.9 % 100 mL IVPB     2 g 200 mL/hr over 30 Minutes Intravenous Every 8 hours 10/05/18 2038 10/10/18 2159   10/05/18 1930  levofloxacin (LEVAQUIN) IVPB 750 mg  Status:  Discontinued     750 mg 100 mL/hr over 90 Minutes Intravenous  Once 10/05/18 1924 10/05/18 2040      Sch Meds:  Scheduled Meds: . ARIPiprazole  5 mg Oral Daily  . diltiazem  60 mg Oral Q12H  . enoxaparin (LOVENOX) injection  120 mg Subcutaneous Q12H  . insulin aspart  0-15 Units Subcutaneous TID WC  . insulin aspart  0-5 Units Subcutaneous QHS  . insulin aspart  8 Units Subcutaneous TID WC  . insulin glargine  20 Units Subcutaneous BID  . mouth rinse  15 mL Mouth Rinse BID  . methylPREDNISolone (SOLU-MEDROL) injection  40 mg Intravenous Q6H  . metoprolol tartrate  50 mg Oral BID  . mometasone-formoterol  2 puff Inhalation BID  . oxcarbazepine  1,200 mg Oral QHS  . sertraline  50 mg Oral Daily   Continuous Infusions: . azithromycin Stopped (10/09/18 0225)  . aztreonam 2 g (10/09/18 0529)  . vancomycin Stopped (10/09/18 0039)   PRN Meds:.  Holli Humbles [facility]  If 7PM-7AM, please contact night-coverage [email or web address] Password [email or web address] 10/09/2018, 8:16 AM

## 2018-10-09 NOTE — Progress Notes (Signed)
Pt is in Afib that telemetry states started at about 02:31am.  Pt's current HR varies between 97-115. Dr. Josephine Cables paged about the rhythm change and states that he would like to continue to watch the HR.  MD to consider medications if HR is between 125-130's.

## 2018-10-09 NOTE — Progress Notes (Signed)
[place] for Lovenox Indication: atrial fibrillation  Allergies  Allergen Reactions  . Banana Anaphylaxis  . Coconut Flavor Anaphylaxis  . Keflex [Cephalexin] Anaphylaxis  . Penicillins Anaphylaxis    Did it involve swelling of the face/tongue/throat, SOB, or low BP? yes Did it involve sudden or severe rash/hives, skin peeling, or any reaction on the inside of your mouth or nose? no Did you need to seek medical attention at a hospital or doctor's office? yes When did it last happen?8 years ago If all above answers are "NO", may proceed with cephalosporin use.  Marland Kitchen Pineapple Anaphylaxis  . Other Hives    All berries cause hives   Patient Measurements: Height: 5' 9.5" (176.5 cm) Weight: 260 lb 2 oz (118 kg) IBW/kg (Calculated) : 67.35  Vital Signs: Temp: 98.2 F (36.8 C) (07/15 1157) Temp Source: Oral (07/15 1157) BP: 119/105 (07/15 1157) Pulse Rate: 105 (07/15 0415)  Labs: Recent Labs    10/06/18 1659  10/07/18 0645 10/08/18 0450 10/09/18 0530  HGB  --    < > 11.7* 11.3* 11.4*  HCT  --   --  34.1* 33.8* 34.1*  PLT  --   --  406* 431* 498*  CREATININE  --    < > 0.88 0.95 0.79  CKTOTAL  --   --  46 23* 11*  TROPONINIHS 37*  --   --   --   --    < > = values in this interval not displayed.   Estimated Creatinine Clearance: 108.6 mL/min (by C-G formula based on SCr of 0.79 mg/dL).   Assessment:  81 yoF admit with chest pain.  Found to have Afib and started on full dose Lovenox.  Renal function is stable; no bleeding reported.  Goal of Therapy:  Monitor platelets by anticoagulation protocol: Yes   Plan:  Continue Lovenox 120mg  SQ Q12H CBC Q72H while on Lovenox Pharmacy will sign off and follow peripherally.  Thank you for the consult!  Pearline Yerby D. Mina Marble, PharmD, BCPS, [place] 10/09/2018, 1:21 PM

## 2018-10-10 LAB — CBC WITH DIFFERENTIAL/PLATELET
Abs Immature Granulocytes: 0.42 10*3/uL — ABNORMAL HIGH (ref 0.00–0.07)
Basophils Absolute: 0.1 10*3/uL (ref 0.0–0.1)
Basophils Relative: 0 %
Eosinophils Absolute: 0 10*3/uL (ref 0.0–0.5)
Eosinophils Relative: 0 %
HCT: 35.3 % — ABNORMAL LOW (ref 36.0–46.0)
Hemoglobin: 11.4 g/dL — ABNORMAL LOW (ref 12.0–15.0)
Immature Granulocytes: 2 %
Lymphocytes Relative: 8 %
Lymphs Abs: 1.5 10*3/uL (ref 0.7–4.0)
MCH: 26.3 pg (ref 26.0–34.0)
MCHC: 32.3 g/dL (ref 30.0–36.0)
MCV: 81.5 fL (ref 80.0–100.0)
Monocytes Absolute: 0.8 10*3/uL (ref 0.1–1.0)
Monocytes Relative: 5 %
Neutro Abs: 14.9 10*3/uL — ABNORMAL HIGH (ref 1.7–7.7)
Neutrophils Relative %: 85 %
Platelets: 571 10*3/uL — ABNORMAL HIGH (ref 150–400)
RBC: 4.33 MIL/uL (ref 3.87–5.11)
RDW: 14.5 % (ref 11.5–15.5)
WBC: 17.7 10*3/uL — ABNORMAL HIGH (ref 4.0–10.5)
nRBC: 0 % (ref 0.0–0.2)

## 2018-10-10 LAB — CK: Total CK: 10 U/L — ABNORMAL LOW (ref 38–234)

## 2018-10-10 LAB — COMPREHENSIVE METABOLIC PANEL
ALT: 20 U/L (ref 0–44)
AST: 15 U/L (ref 15–41)
Albumin: 2.3 g/dL — ABNORMAL LOW (ref 3.5–5.0)
Alkaline Phosphatase: 100 U/L (ref 38–126)
Anion gap: 12 (ref 5–15)
BUN: 42 mg/dL — ABNORMAL HIGH (ref 6–20)
CO2: 23 mmol/L (ref 22–32)
Calcium: 8.8 mg/dL — ABNORMAL LOW (ref 8.9–10.3)
Chloride: 103 mmol/L (ref 98–111)
Creatinine, Ser: 0.68 mg/dL (ref 0.44–1.00)
GFR calc Af Amer: >60 mL/min (ref >60)
GFR calc non Af Amer: >60 mL/min (ref >60)
Glucose, Bld: 158 mg/dL — ABNORMAL HIGH (ref 70–99)
Potassium: 3.6 mmol/L (ref 3.5–5.1)
Sodium: 138 mmol/L (ref 135–145)
Total Bilirubin: 0.6 mg/dL (ref 0.3–1.2)
Total Protein: 6.3 g/dL — ABNORMAL LOW (ref 6.5–8.1)

## 2018-10-10 LAB — D-DIMER, QUANTITATIVE: D-Dimer, Quant: 1.15 ug/mL-FEU — ABNORMAL HIGH (ref 0.00–0.50)

## 2018-10-10 LAB — C-REACTIVE PROTEIN: CRP: 7.2 mg/dL — ABNORMAL HIGH (ref <1.0)

## 2018-10-10 LAB — GLUCOSE, CAPILLARY
Glucose-Capillary: 190 mg/dL — ABNORMAL HIGH (ref 70–99)
Glucose-Capillary: 222 mg/dL — ABNORMAL HIGH (ref 70–99)
Glucose-Capillary: 225 mg/dL — ABNORMAL HIGH (ref 70–99)
Glucose-Capillary: 254 mg/dL — ABNORMAL HIGH (ref 70–99)
Glucose-Capillary: 308 mg/dL — ABNORMAL HIGH (ref 70–99)

## 2018-10-10 LAB — CULTURE, BLOOD (ROUTINE X 2): Culture: NO GROWTH

## 2018-10-10 LAB — FERRITIN: Ferritin: 192 ng/mL (ref 11–307)

## 2018-10-10 NOTE — Progress Notes (Signed)
  Inpatient Diabetes Program Recommendations  AACE/ADA: New Consensus Statement on Inpatient Glycemic Control (2015)  Target Ranges:  Prepandial:   less than 140 mg/dL      Peak postprandial:   less than 180 mg/dL (1-2 hours)      Critically ill patients:  140 - 180 mg/dL   Lab Results  Component Value Date   GLUCAP 254 (H) 10/10/2018   HGBA1C 7.8 (H) 10/06/2018    Review of Glycemic Control Results for DANGELA, HOW (MRN [id number]) as of 10/10/2018 17:42  Ref. Range 10/09/2018 15:32 10/09/2018 21:52 10/10/2018 08:08 10/10/2018 11:39 10/10/2018 16:33  Glucose-Capillary Latest Ref Range: 70 - 99 mg/dL 313 (H) 228 (H) 308 (H) 190 (H) 254 (H)    Inpatient Diabetes Program Recommendations:   Please consider increase in Novolog meal coverage to 12 units tid if eats 50%. Secure text to Dr. Avon Gully with recommendations.  Thank you, Nani Gasser. Dona Walby, RN, MSN, CDE  Diabetes Coordinator Inpatient Glycemic Control Team Team Pager [phone] (8am-5pm) 10/10/2018 5:43 PM

## 2018-10-10 NOTE — Progress Notes (Signed)
PROGRESS NOTE  Karsten RoLydia N Vantol ZOX:[id number] DOB: 1960/09/16   PCP: Marcine MatarJohnson, Deborah B, MD  Patient is from: Home  DOA: 10/05/2018 LOS: 5  Brief Narrative / Interim history: 58 year old female with schizoaffective disorder, hypertension, CAD and recent COVID 19+ on 08/29/2018 presenting with left-sided chest pain and admitted for HCAP (RUL pneumonia), mild DKA and elevated liver enzymes. In ED, afebrile but hypotensive to 80/58.  Desaturated to 89% and started on nasal cannula.  COVID-19 positive.  WBC 22.3.  With bandemia and lymphopenia.  Sodium 131.  Bicarb 20.  Glucose 339.  Creatinine 1.31.  Anion gap 17. ALP 196. AST 80. ALT 71. Total bili 4.1. BNP 600. CK 45. CK-MB 8.5. LDH 200.  High-sensitivity troponin 13> 10.  Ferritin 414.  CRP 36.4.  Lactic acid 2.1.  Procalcitonin 6.37.  CTA RUL pneumonia. Started on aztreonam and azithromycin for RUL pneumonia.  Started on IV fluid and insulin drip for mild DKA.  Assessment & Plan: Principal Problem:   HCAP (healthcare-associated pneumonia) Active Problems:   COVID-19 virus infection   Diabetes mellitus type 2, uncontrolled, without complications (HCC)   Essential hypertension   Bipolar disorder (HCC)   Abnormal liver function   Chest pain   AF (paroxysmal atrial fibrillation) (HCC)   Acute CHF (congestive heart failure) (HCC)   Acute hypoxic respiratory failure: Multifactorial including questionable heart failure/overload, new onset provoked A. fib with RVR,  RUL pneumonia with COVID-19 positive labs, improving -See below for treatment  RUL pneumonia/hypoxic respiratory failure in patient with COVID-19 virus, improving Likely bacterial given moderately elevated procalcitonin Continue aztreonam and azithromycin-history of anaphylaxis with penicillin - stop date 10/09/18 Continue steroids - begin taper given improvement in symptoms No Remdesivir at this time given covid positive testing >30 days ago - unlikely involved in this acute  process Recent Labs    10/08/18 0450 10/09/18 0530 10/10/18 0400  DDIMER 1.22* 0.99* 1.15*  FERRITIN 547* 263 192  CRP 22.1* 12.2* 7.2*   Lab Results  Component Value Date   SARSCOV2NAA POSITIVE (A) 10/05/2018   SARSCOV2NAA POSITIVE (A) 08/29/2018   Volume overload, cannot rule out heart failure, no echo on file  Patient hypoxic status post aggressive IV fluids for DKA with subsequent volume overload BNP markedly elevated previously (1500, up-trending from 600 after fluids administered) Responded to IV Lasix well. Excellent urine output with clinical improvement. Lasix now on hold given patient's lactic acidosis as below Cardiology previously following: per Dr. Anne FuSkains - Holding off Echo to minimize staff exposure to COVID-19 Will need outpatient echo per cardiology  Newly diagnosed, likely provoked, A. fib with RVR: continues but now rate controlled  -Off Cardizem drip >24h; [place] 6 -Continue p.o. metoprolol - add diltiazem given a-fib with borderline rate at 100 overnight Goal heart rate <110 at rest; <130 with exertion -Continue Lovenox for anticoagulation -likely transition to PO anticoagulation at DC  Elevated troponin: Likely demand ischemia, resolved -Secondary due to hypoxia, fluid overload and a-fib with RVR. No acute ischemic finding on EKG.   -Right shoulder/chest pain is very atypical -appears to be pleuritic/musculoskeletal in nature -Troponin subsequently unremarkable  Questionable history of CAD/MI?  -Follow with outpatient cardiology as above -On metoprolol now as above  Elevated liver enzymes/hyperbilirubinemia:  Likely in the setting of congestion/volume overload versus recent COVID-19 infection.  NIDDM-2, markedly uncontrolled, rule out DKA: POA, resolved  -A1c 7.8%. On insulin, metformin and Ste[place]arto at home. CBG elevated. -Hold home medications -Continue SSI- increase to resistant/obese dose sliding -  Continue Lantus to 20 units twice daily  and continue mealtime NovoLog at 8 units  Schizoaffective disorder, Stable.   -Has not filled most of her psych medications recently per med rec but patient reports good compliance -Psychiatry consulted-recommended resuming Abilify, Trileptal and Zoloft -Follow further recommendations.  Hypertension: Controlled  -IV Lasix and metoprolol as above  History of asthma?   -On Advair at home. [facility] and as needed albuterol  DVT prophylaxis: Full dose Lovenox for A. fib Code Status: Full code Family Communication: Patient and daughter over the phone while in the room with pt.  Disposition Plan: Remains inpatient, likely discharge home in the next 24 to 48 hours pending clinical course and resolution of hypoxia Consultants: None  Subjective: No acute issues or events overnight, patient's heart rate somewhat elevated early this morning, remains on 2 L nasal cannula this morning but symptoms appear to be much improved from previous.  Patient states her previous right shoulder pain is all but resolved.  Declines nausea, vomiting, diarrhea, constipation, headache, fevers, chills..  [facility]  She says she was there last month.   Objective: Vitals:   10/10/18 0055 10/10/18 0500 10/10/18 0534 10/10/18 1130  BP: 103/82  (!) 99/58 96/61  Pulse: 96  91 74  Resp: (!) 22  18 18   Temp: 97.8 F (36.6 C)  97.8 F (36.6 C) 98.7 F (37.1 C)  TempSrc: Oral  Oral Oral  SpO2: 95%  91% 94%  Weight:  114.8 kg    Height:        Intake/Output Summary (Last 24 hours) at 10/10/2018 1631 Last data filed at 10/10/2018 0600 Gross per 24 hour  Intake 300 ml  Output 300 ml  Net 0 ml   Filed Weights   10/08/18 0241 10/09/18 0420 10/10/18 0500  Weight: 117 kg 118 kg 114.8 kg    Examination:  General:  Pleasantly resting in bed, No acute distress. HEENT:  Normocephalic atraumatic.  Sclerae nonicteric, noninjected.  Extraocular movements intact bilaterally. Neck:  Without mass or  deformity.  Trachea is midline. Lungs:  Clear to auscultate bilaterally without rhonchi, wheeze, or rales. Heart:  Regular rate and rhythm.  Without murmurs, rubs, or gallops. Abdomen:  Soft, nontender, nondistended.  Without guarding or rebound. Extremities: Without cyanosis, clubbing, edema, or obvious deformity. Vascular:  Dorsalis pedis and posterior tibial pulses palpable bilaterally. Skin:  Warm and dry, no erythema, no ulcerations.  I have personally reviewed the following labs and images:  Radiology Studies: No results found.  Microbiology: Recent Results (from the past 240 hour(s))  SARS Coronavirus 2 (CEPHEID - Performed in [facility] hospital lab), Hosp Order     Status: Abnormal   Collection Time: 10/05/18  5:40 PM   Specimen: Nasopharyngeal Swab  Result Value Ref Range Status   SARS Coronavirus 2 POSITIVE (A) NEGATIVE Final    Comment: RESULT CALLED TO, READ BACK BY AND VERIFIED WITH: RN Pattricia Boss KENNEDY [phone] MLM (NOTE) If result is NEGATIVE SARS-CoV-2 target nucleic acids are NOT DETECTED. The SARS-CoV-2 RNA is generally detectable in upper and lower  respiratory specimens during the acute phase of infection. The lowest  concentration of SARS-CoV-2 viral copies this assay can detect is 250  copies / mL. A negative result does not preclude SARS-CoV-2 infection  and should not be used as the sole basis for treatment or other  patient management decisions.  A negative result may occur with  improper specimen collection / handling, submission of specimen  other  than nasopharyngeal swab, presence of viral mutation(s) within the  areas targeted by this assay, and inadequate number of viral copies  (<250 copies / mL). A negative result must be combined with clinical  observations, patient history, and epidemiological information. If result is POSITIVE SARS-CoV-2 target nucleic acids are DETECTED. The SARS -CoV-2 RNA is generally detectable in upper and lower    respiratory specimens during the acute phase of infection.  Positive  results are indicative of active infection with SARS-CoV-2.  Clinical  correlation with patient history and other diagnostic information is  necessary to determine patient infection status.  Positive results do  not rule out bacterial infection or co-infection with other viruses. If result is PRESUMPTIVE POSTIVE SARS-CoV-2 nucleic acids MAY BE PRESENT.   A presumptive positive result was obtained on the submitted specimen  and confirmed on repeat testing.  While 2019 novel coronavirus  (SARS-CoV-2) nucleic acids may be present in the submitted sample  additional confirmatory testing may be necessary for epidemiological  and / or clinical management purposes  to differentiate between  SARS-CoV-2 and other Sarbecovirus currently known to infect humans.  If clinically indicated additional testing with an alternate test  methodology [phone] is advise d. The SARS-CoV-2 RNA is generally  detectable in upper and lower respiratory specimens during the acute  phase of infection. The expected result is Negative. Fact Sheet for Patients:  BoilerBrush.com.cyhttps://www.fda.gov/media/136312/download Fact Sheet for Healthcare Providers: https://pope.com/https://www.fda.gov/media/136313/download This test is not yet approved or cleared by the [place] FDA and has been authorized for detection and/or diagnosis of SARS-CoV-2 by FDA under an Emergency Use Authorization (EUA).  This EUA will remain in effect (meaning this test can be used) for the duration of the COVID-19 declaration under Section 564(b)(1) of the Act, 21 U.S.C. section 360bbb-3(b)(1), unless the authorization is terminated or revoked sooner. Performed at [facility], [address] [place], [place] [address]   Culture, blood (routine x 2)     Status: None   Collection Time: 10/05/18  8:21 PM   Specimen: BLOOD  Result Value Ref Range Status   Specimen Description BLOOD RIGHT  ANTECUBITAL  Final   Special Requests   Final    BOTTLES DRAWN AEROBIC AND ANAEROBIC Blood Culture results may not be optimal due to an excessive volume of blood received in culture bottles   Culture   Final    NO GROWTH 5 DAYS Performed at [facility], [address] [place], [place] [address]    Report Status 10/10/2018 FINAL  Final  Culture, blood (routine x 2)     Status: None (Preliminary result)   Collection Time: 10/06/18  7:20 AM   Specimen: BLOOD  Result Value Ref Range Status   Specimen Description BLOOD LEFT ANTECUBITAL  Final   Special Requests   Final    BOTTLES DRAWN AEROBIC ONLY Blood Culture results may not be optimal due to an inadequate volume of blood received in culture bottles   Culture   Final    NO GROWTH 4 DAYS Performed at [facility], [address] [place], [place] [address]    Report Status PENDING  Incomplete  MRSA PCR Screening     Status: None   Collection Time: 10/08/18  6:07 PM   Specimen: Nasal Mucosa; Nasopharyngeal  Result Value Ref Range Status   MRSA by PCR NEGATIVE NEGATIVE Final    Comment:        The GeneXpert MRSA Assay (FDA  approved for NASAL specimens only), is one component of a comprehensive MRSA colonization surveillance program. It is not intended to diagnose MRSA infection nor to guide or monitor treatment for MRSA infections. Performed at [facility], [address] [place], [place] [address]     Sepsis Labs: Invalid input(s): PROCALCITONIN, LACTICIDVEN  Urine analysis: No results found for: COLORURINE, APPEARANCEUR, LABSPEC, PHURINE, GLUCOSEU, HGBUR, BILIRUBINUR, KETONESUR, PROTEINUR, UROBILINOGEN, NITRITE, LEUKOCYTESUR  Anemia Panel: Recent Labs    10/09/18 0530 10/10/18 0400  FERRITIN 263 192    Thyroid Function Tests: No results for input(s): TSH, T4TOTAL, FREET4, T3FREE, THYROIDAB in the last 72 hours.  Lipid Profile: No results for input(s): CHOL, HDL, LDLCALC, TRIG,  CHOLHDL, LDLDIRECT in the last 72 hours.  CBG: Recent Labs  Lab 10/09/18 1203 10/09/18 1532 10/09/18 2152 10/10/18 0808 10/10/18 1139  GLUCAP 338* 313* 228* 308* 190*    HbA1C: No results for input(s): HGBA1C in the last 72 hours.  BNP (last 3 results): No results for input(s): PROBNP in the last 8760 hours.  Cardiac Enzymes: Recent Labs  Lab 10/05/18 2020 10/06/18 0707 10/07/18 0645 10/08/18 0450 10/09/18 0530 10/10/18 0400  CKTOTAL 45 44 46 23* 11* 10*  CKMB 8.5*  --   --   --   --   --     Coagulation Profile: No results for input(s): INR, PROTIME in the last 168 hours.  Liver Function Tests: Recent Labs  Lab 10/06/18 0707 10/06/18 1255 10/07/18 0645 10/08/18 0450 10/09/18 0530 10/10/18 0400  AST 36  --  16 12* 12* 15  ALT 50*  --  36 ALKPHOS 169*  --  152* 144* 121 100  BILITOT 3.2* 2.9* 1.5* 0.8 0.5 0.6  PROT 7.0  --  6.7 6.4* 6.1* 6.3*  ALBUMIN 2.4*  --  2.1* 2.2* 2.2* 2.3*   No results for input(s): LIPASE, AMYLASE in the last 168 hours. No results for input(s): AMMONIA in the last 168 hours.  Basic Metabolic Panel: Recent Labs  Lab 10/06/18 1827 10/07/18 0645 10/08/18 0450 10/09/18 0530 10/10/18 0400  NA 133* 135 136 134* 138  K 4.1 3.7 3.3* 3.6 3.6  CL 102 100 100 100 103  CO2 17* GLUCOSE 347* 333* 244* 229* 158*  BUN 21* 28* 46* 48* 42*  CREATININE 0.87 0.88 0.95 0.79 0.68  CALCIUM 9.1 9.3 8.6* 8.8* 8.8*  MG 1.8 2.0  --   --   --    GFR: Estimated Creatinine Clearance: 107.1 mL/min (by C-G formula based on SCr of 0.68 mg/dL).  CBC: Recent Labs  Lab 10/06/18 0707 10/07/18 0645 10/08/18 0450 10/09/18 0530 10/10/18 0400  WBC 18.6* 22.7* 24.4* 18.3* 17.7*  NEUTROABS 17.0* 20.8* 22.4* 16.1* 14.9*  HGB 12.4 11.7* 11.3* 11.4* 11.4*  HCT 36.6 34.1* 33.8* 34.1* 35.3*  MCV 80.3 79.5* 80.1 80.2 81.5  PLT 318 406* 431* 498* 571*    Procedures:  None  Microbiology summarized: COVID-19  positive.  Antimicrobials: Anti-infectives (From admission, onward)   Start     Dose/Rate Route Frequency Ordered Stop   10/06/18 1000  vancomycin (VANCOCIN) IVPB 750 mg/150 ml premix  Status:  Discontinued     750 mg 150 mL/hr over 60 Minutes Intravenous Every 12 hours 10/05/18 2038 10/09/18 1331   10/05/18 2200  vancomycin (VANCOCIN) 2,000 mg in sodium chloride 0.9 % 500 mL IVPB     2,000 mg 250 mL/hr over 120 Minutes Intravenous  Once 10/05/18 2038 10/06/18 0044   10/05/18 2200  azithromycin (ZITHROMAX) 500 mg in sodium chloride 0.9 % 250 mL IVPB     500 mg 250 mL/hr over 60 Minutes Intravenous Every 24 hours 10/05/18 2107 10/09/18 2215   10/05/18 2100  aztreonam (AZACTAM) 2 g in sodium chloride 0.9 % 100 mL IVPB     2 g 200 mL/hr over 30 Minutes Intravenous Every 8 hours 10/05/18 2038 10/09/18 2326   10/05/18 1930  levofloxacin (LEVAQUIN) IVPB 750 mg  Status:  Discontinued     750 mg 100 mL/hr over 90 Minutes Intravenous  Once 10/05/18 1924 10/05/18 2040      Sch Meds:  Scheduled Meds:  ARIPiprazole  5 mg Oral Daily   diltiazem  30 mg Oral Q6H   enoxaparin (LOVENOX) injection  120 mg Subcutaneous Q12H   insulin aspart  0-20 Units Subcutaneous TID WC   insulin aspart  0-5 Units Subcutaneous QHS   insulin aspart  8 Units Subcutaneous TID WC   insulin glargine  20 Units Subcutaneous BID   mouth rinse  15 mL Mouth Rinse BID   methylPREDNISolone (SOLU-MEDROL) injection  40 mg Intravenous Q6H   metoprolol tartrate  50 mg Oral BID   mometasone-formoterol  2 puff Inhalation BID   oxcarbazepine  1,200 mg Oral QHS   sertraline  50 mg Oral Daily   Continuous Infusions:  PRN Meds:.  Holli Humbles [facility]  If 7PM-7AM, please contact night-coverage [email or web address] Password [facility] 10/10/2018, 4:31 PM

## 2018-10-11 LAB — GLUCOSE, CAPILLARY
Glucose-Capillary: 226 mg/dL — ABNORMAL HIGH (ref 70–99)
Glucose-Capillary: 273 mg/dL — ABNORMAL HIGH (ref 70–99)
Glucose-Capillary: 261 mg/dL — ABNORMAL HIGH (ref 70–99)
Glucose-Capillary: 251 mg/dL — ABNORMAL HIGH (ref 70–99)

## 2018-10-11 LAB — CULTURE, BLOOD (ROUTINE X 2): Culture: NO GROWTH

## 2018-10-11 LAB — D-DIMER, QUANTITATIVE: D-Dimer, Quant: 0.95 ug/mL-FEU — ABNORMAL HIGH (ref 0.00–0.50)

## 2018-10-11 LAB — C-REACTIVE PROTEIN: CRP: 3.2 mg/dL — ABNORMAL HIGH (ref <1.0)

## 2018-10-11 LAB — FERRITIN: Ferritin: 144 ng/mL (ref 11–307)

## 2018-10-11 NOTE — Progress Notes (Signed)
Inpatient Diabetes Program Recommendations  AACE/ADA: New Consensus Statement on Inpatient Glycemic Control (2015)  Target Ranges:  Prepandial:   less than 140 mg/dL      Peak postprandial:   less than 180 mg/dL (1-2 hours)      Critically ill patients:  140 - 180 mg/dL   Lab Results  Component Value Date   GLUCAP 273 (H) 10/11/2018   HGBA1C 7.8 (H) 10/06/2018    Review of Glycemic Control Results for Lauren Cummings, Lauren Cummings (MRN [id number]) as of 10/11/2018 14:54  Ref. Range 10/10/2018 23:44 10/11/2018 08:11 10/11/2018 12:39  Glucose-Capillary Latest Ref Range: 70 - 99 mg/dL 225 (H) 226 (H) 273 (H)  Diabetes history:Type 2 DM Outpatient Diabetes medications:Metformin 1000 mg BID, Lantus 20 units BID, Admelog 12 units TID Current orders for Inpatient glycemic control:Lantus 20 units BID, Novolog 0-15 unit TID, Novolog 0-5 units QHS, Novolog 8 units tid with  meals Inpatient Diabetes Program Recommendations:    Consider increasing Lantus to 25 units bid.   Thanks  Adah Perl, RN, BC-ADM Inpatient Diabetes Coordinator Pager [phone]

## 2018-10-11 NOTE — Progress Notes (Signed)
PROGRESS NOTE  Lauren Cummings AVW:[id number] DOB: 10/07/1960   PCP: Marcine Matar, MD  Patient is from: Home  DOA: 10/05/2018 LOS: 6  Brief Narrative / Interim history: 58 year old female with schizoaffective disorder, hypertension, CAD and recent COVID 19+ on 08/29/2018 presenting with left-sided chest pain and admitted for HCAP (RUL pneumonia), mild DKA and elevated liver enzymes. In ED, afebrile but hypotensive to 80/58.  Desaturated to 89% and started on nasal cannula.  COVID-19 positive.  WBC 22.3.  With bandemia and lymphopenia.  Sodium 131.  Bicarb 20.  Glucose 339.  Creatinine 1.31.  Anion gap 17. ALP 196. AST 80. ALT 71. Total bili 4.1. BNP 600. CK 45. CK-MB 8.5. LDH 200.  High-sensitivity troponin 13> 10.  Ferritin 414.  CRP 36.4.  Lactic acid 2.1.  Procalcitonin 6.37.  CTA RUL pneumonia. Started on aztreonam and azithromycin for RUL pneumonia.  Started on IV fluid and insulin drip for mild DKA.  Assessment & Plan: Principal Problem:   HCAP (healthcare-associated pneumonia) Active Problems:   COVID-19 virus infection   Diabetes mellitus type 2, uncontrolled, without complications (HCC)   Essential hypertension   Bipolar disorder (HCC)   Abnormal liver function   Chest pain   AF (paroxysmal atrial fibrillation) (HCC)   Acute CHF (congestive heart failure) (HCC)   Acute hypoxic respiratory failure: Multifactorial including questionable heart failure/overload, new onset provoked A. fib with RVR,  RUL pneumonia with COVID-19 positive labs, improving -See below for treatment  RUL pneumonia/hypoxic respiratory failure in patient with COVID-19 virus, improving Likely bacterial given moderately elevated procalcitonin Aztreonam and azithromycin-history of anaphylaxis with penicillin - stop date 10/09/18 Continue steroids - begin taper given improvement in symptoms No Remdesivir at this time given covid positive testing >30 days ago - unlikely involved in this acute process Recent  Labs    10/09/18 0530 10/10/18 0400 10/11/18 0230  DDIMER 0.99* 1.15* 0.95*  FERRITIN 263 192 144  CRP 12.2* 7.2* 3.2*   Lab Results  Component Value Date   SARSCOV2NAA POSITIVE (A) 10/05/2018   SARSCOV2NAA POSITIVE (A) 08/29/2018   Volume overload, cannot rule out heart failure, no echo on file  Patient hypoxic status post aggressive IV fluids for DKA with subsequent volume overload-now resolved BNP markedly elevated previously (1500, up-trending from 600 after fluids administered) Responded to IV Lasix well. Excellent urine output with clinical improvement. Lasix now on hold given patient's lactic acidosis as below Cardiology previously following: per Dr. Anne Fu - Holding off Echo to minimize staff exposure to COVID-19 Will need outpatient echo per cardiology  Newly diagnosed, likely provoked, A. fib with RVR: continues but now rate controlled  -Off Cardizem drip >24h; [place] vasc score 6 -Continue p.o. metoprolol - add diltiazem given a-fib with borderline rate at 100 overnight Goal heart rate <110 at rest; <130 with exertion -Continue Lovenox for anticoagulation -likely transition to PO anticoagulation at DC  Elevated troponin: Likely demand ischemia, resolved -Secondary due to hypoxia, fluid overload and a-fib with RVR. No acute ischemic finding on EKG.   -Right shoulder/chest pain is very atypical -appears to be pleuritic/musculoskeletal in nature -Troponin subsequently unremarkable  Questionable history of CAD/MI?  -Follow with outpatient cardiology as above -On metoprolol now as above  Elevated liver enzymes/hyperbilirubinemia:  Likely in the setting of congestion/volume overload versus recent COVID-19 infection.  NIDDM-2, markedly uncontrolled, DKA resolved: POA -A1c 7.8%. On insulin, metformin and [place]o at home. CBG elevated. -Hold home diabetic medications -Continue SSI- increase to resistant/obese dose sliding -Continue Lantus  to 20 units twice daily and  continue mealtime NovoLog at 8 units  Schizoaffective disorder, Stable.   -Has not filled most of her psych medications recently per med rec but patient reports good compliance -Psychiatry consulted-recommended resuming Abilify, Trileptal and Zoloft -Follow further recommendations.  Hypertension: Controlled  -IV Lasix and metoprolol as above  History of asthma?   -On Advair at home. [facility] and as needed albuterol  DVT prophylaxis: Full dose Lovenox for A. fib Code Status: Full code Family Communication: Patient and daughter over speaker phone while in the room with pt.  Disposition Plan: Remains inpatient, likely discharge home in the next 24 to 48 hours pending clinical course and resolution of hypoxia Consultants: None  Subjective: No acute issues or events overnight, patient's heart rate somewhat elevated early this morning, remains on 2 L nasal cannula this morning but symptoms appear to be much improved from previous.  Patient states her previous right shoulder pain is all but resolved.  Declines nausea, vomiting, diarrhea, constipation, headache, fevers, chills..  Patient refuses transfer to [facility].  She says she was there last month.   Objective: Vitals:   10/11/18 0000 10/11/18 0100 10/11/18 0500 10/11/18 0810  BP:  117/73 106/74   Pulse:      Resp:      Temp: 98 F (36.7 C)  98.3 F (36.8 C) 98.6 F (37 C)  TempSrc: Oral  Oral Oral  SpO2:      Weight:   117.7 kg   Height:        Intake/Output Summary (Last 24 hours) at 10/11/2018 0829 Last data filed at 10/11/2018 0530 Gross per 24 hour  Intake 940 ml  Output 1550 ml  Net -610 ml   Filed Weights   10/09/18 0420 10/10/18 0500 10/11/18 0500  Weight: 118 kg 114.8 kg 117.7 kg    Examination:  General:  Pleasantly resting in bed, No acute distress. HEENT:  Normocephalic atraumatic.  Sclerae nonicteric, noninjected.  Extraocular movements intact bilaterally. Neck:  Without mass or deformity.  Trachea is  midline. Lungs:  Clear to auscultate bilaterally without rhonchi, wheeze, or rales. Heart:  Regular rate and rhythm.  Without murmurs, rubs, or gallops. Abdomen:  Soft, nontender, nondistended.  Without guarding or rebound. Extremities: Without cyanosis, clubbing, edema, or obvious deformity. Vascular:  Dorsalis pedis and posterior tibial pulses palpable bilaterally. Skin:  Warm and dry, no erythema, no ulcerations.  I have personally reviewed the following labs and images:  Radiology Studies: No results found.  Microbiology: Recent Results (from the past 240 hour(s))  SARS Coronavirus 2 (CEPHEID - Performed in [place] hospital lab), Hosp Order     Status: Abnormal   Collection Time: 10/05/18  5:40 PM   Specimen: Nasopharyngeal Swab  Result Value Ref Range Status   SARS Coronavirus 2 POSITIVE (A) NEGATIVE Final    Comment: RESULT CALLED TO, READ BACK BY AND VERIFIED WITH: RN Teofilo Pod [phone] 1910 MLM (NOTE) If result is NEGATIVE SARS-CoV-2 target nucleic acids are NOT DETECTED. The SARS-CoV-2 RNA is generally detectable in upper and lower  respiratory specimens during the acute phase of infection. The lowest  concentration of SARS-CoV-2 viral copies this assay can detect is 250  copies / mL. A negative result does not preclude SARS-CoV-2 infection  and should not be used as the sole basis for treatment or other  patient management decisions.  A negative result may occur with  improper specimen collection / handling, submission of specimen other  than nasopharyngeal  swab, presence of viral mutation(s) within the  areas targeted by this assay, and inadequate number of viral copies  (<250 copies / mL). A negative result must be combined with clinical  observations, patient history, and epidemiological information. If result is POSITIVE SARS-CoV-2 target nucleic acids are DETECTED. The SARS -CoV-2 RNA is generally detectable in upper and lower  respiratory specimens during the  acute phase of infection.  Positive  results are indicative of active infection with SARS-CoV-2.  Clinical  correlation with patient history and other diagnostic information is  necessary to determine patient infection status.  Positive results do  not rule out bacterial infection or co-infection with other viruses. If result is PRESUMPTIVE POSTIVE SARS-CoV-2 nucleic acids MAY BE PRESENT.   A presumptive positive result was obtained on the submitted specimen  and confirmed on repeat testing.  While 2019 novel coronavirus  (SARS-CoV-2) nucleic acids may be present in the submitted sample  additional confirmatory testing may be necessary for epidemiological  and / or clinical management purposes  to differentiate between  SARS-CoV-2 and other Sarbecovirus currently known to infect humans.  If clinically indicated additional testing with an alternate test  methodology [phone]([id number]) is advise d. The SARS-CoV-2 RNA is generally  detectable in upper and lower respiratory specimens during the acute  phase of infection. The expected result is Negative. Fact Sheet for Patients:  BoilerBrush.com.cyhttps://www.fda.gov/media/136312/download Fact Sheet for Healthcare Providers: https://pope.com/https://www.fda.gov/media/136313/download This test is not yet approved or cleared by the [place] FDA and has been authorized for detection and/or diagnosis of SARS-CoV-2 by FDA under an Emergency Use Authorization (EUA).  This EUA will remain in effect (meaning this test can be used) for the duration of the COVID-19 declaration under Section 564(b)(1) of the Act, 21 U.S.C. section 360bbb-3(b)(1), unless the authorization is terminated or revoked sooner. Performed at [facility], [address] [place], [place] [address]   Culture, blood (routine x 2)     Status: None   Collection Time: 10/05/18  8:21 PM   Specimen: BLOOD  Result Value Ref Range Status   Specimen Description BLOOD RIGHT ANTECUBITAL  Final   Special Requests    Final    BOTTLES DRAWN AEROBIC AND ANAEROBIC Blood Culture results may not be optimal due to an excessive volume of blood received in culture bottles   Culture   Final    NO GROWTH 5 DAYS Performed at [facility], [address] [place], [place] [address]    Report Status 10/10/2018 FINAL  Final  Culture, blood (routine x 2)     Status: None (Preliminary result)   Collection Time: 10/06/18  7:20 AM   Specimen: BLOOD  Result Value Ref Range Status   Specimen Description BLOOD LEFT ANTECUBITAL  Final   Special Requests   Final    BOTTLES DRAWN AEROBIC ONLY Blood Culture results may not be optimal due to an inadequate volume of blood received in culture bottles   Culture   Final    NO GROWTH 4 DAYS Performed at [facility], [address] [place], [place] [address]    Report Status PENDING  Incomplete  MRSA PCR Screening     Status: None   Collection Time: 10/08/18  6:07 PM   Specimen: Nasal Mucosa; Nasopharyngeal  Result Value Ref Range Status   MRSA by PCR NEGATIVE NEGATIVE Final    Comment:        The GeneXpert MRSA Assay (FDA approved for NASAL specimens only),  is one component of a comprehensive MRSA colonization surveillance program. It is not intended to diagnose MRSA infection nor to guide or monitor treatment for MRSA infections. Performed at [facility], [address] [place], [place] [address]     Sepsis Labs: Invalid input(s): PROCALCITONIN, LACTICIDVEN  Urine analysis: No results found for: COLORURINE, APPEARANCEUR, LABSPEC, PHURINE, GLUCOSEU, HGBUR, BILIRUBINUR, KETONESUR, PROTEINUR, UROBILINOGEN, NITRITE, LEUKOCYTESUR  Anemia Panel: Recent Labs    10/10/18 0400 10/11/18 0230  FERRITIN 192 144    Thyroid Function Tests: No results for input(s): TSH, T4TOTAL, FREET4, T3FREE, THYROIDAB in the last 72 hours.  Lipid Profile: No results for input(s): CHOL, HDL, LDLCALC, TRIG, CHOLHDL, LDLDIRECT in the last 72 hours.   CBG: Recent Labs  Lab 10/10/18 0808 10/10/18 1139 10/10/18 1633 10/10/18 1955 10/10/18 2344  GLUCAP 308* 190* 254* 222* 225*    HbA1C: No results for input(s): HGBA1C in the last 72 hours.  BNP (last 3 results): No results for input(s): PROBNP in the last 8760 hours.  Cardiac Enzymes: Recent Labs  Lab 10/05/18 2020 10/06/18 0707 10/07/18 0645 10/08/18 0450 10/09/18 0530 10/10/18 0400  CKTOTAL 45 44 46 23* 11* 10*  CKMB 8.5*  --   --   --   --   --     Coagulation Profile: No results for input(s): INR, PROTIME in the last 168 hours.  Liver Function Tests: Recent Labs  Lab 10/06/18 0707 10/06/18 1255 10/07/18 0645 10/08/18 0450 10/09/18 0530 10/10/18 0400  AST 36  --  16 12* 12* 15  ALT 50*  --  36 25 21 20   ALKPHOS 169*  --  152* 144* 121 100  BILITOT 3.2* 2.9* 1.5* 0.8 0.5 0.6  PROT 7.0  --  6.7 6.4* 6.1* 6.3*  ALBUMIN 2.4*  --  2.1* 2.2* 2.2* 2.3*   No results for input(s): LIPASE, AMYLASE in the last 168 hours. No results for input(s): AMMONIA in the last 168 hours.  Basic Metabolic Panel: Recent Labs  Lab 10/06/18 1827 10/07/18 0645 10/08/18 0450 10/09/18 0530 10/10/18 0400  NA 133* 135 136 134* 138  K 4.1 3.7 3.3* 3.6 3.6  CL 102 100 100 100 103  CO2 17* 22 22 22 23   GLUCOSE 347* 333* 244* 229* 158*  BUN 21* 28* 46* 48* 42*  CREATININE 0.87 0.88 0.95 0.79 0.68  CALCIUM 9.1 9.3 8.6* 8.8* 8.8*  MG 1.8 2.0  --   --   --    GFR: Estimated Creatinine Clearance: 108.5 mL/min (by C-G formula based on SCr of 0.68 mg/dL).  CBC: Recent Labs  Lab 10/06/18 0707 10/07/18 0645 10/08/18 0450 10/09/18 0530 10/10/18 0400  WBC 18.6* 22.7* 24.4* 18.3* 17.7*  NEUTROABS 17.0* 20.8* 22.4* 16.1* 14.9*  HGB 12.4 11.7* 11.3* 11.4* 11.4*  HCT 36.6 34.1* 33.8* 34.1* 35.3*  MCV 80.3 79.5* 80.1 80.2 81.5  PLT 318 406* 431* 498* 571*    Procedures:  None  Microbiology summarized: COVID-19 positive.  Antimicrobials: Anti-infectives (From  admission, onward)   Start     Dose/Rate Route Frequency Ordered Stop   10/06/18 1000  vancomycin (VANCOCIN) IVPB 750 mg/150 ml premix  Status:  Discontinued     750 mg 150 mL/hr over 60 Minutes Intravenous Every 12 hours 10/05/18 2038 10/09/18 1331   10/05/18 2200  vancomycin (VANCOCIN) 2,000 mg in sodium chloride 0.9 % 500 mL IVPB     2,000 mg 250 mL/hr over 120 Minutes Intravenous  Once 10/05/18 2038 10/06/18 0044  10/05/18 2200  azithromycin (ZITHROMAX) 500 mg in sodium chloride 0.9 % 250 mL IVPB     500 mg 250 mL/hr over 60 Minutes Intravenous Every 24 hours 10/05/18 2107 10/09/18 2215   10/05/18 2100  aztreonam (AZACTAM) 2 g in sodium chloride 0.9 % 100 mL IVPB     2 g 200 mL/hr over 30 Minutes Intravenous Every 8 hours 10/05/18 2038 10/09/18 2326   10/05/18 1930  levofloxacin (LEVAQUIN) IVPB 750 mg  Status:  Discontinued     750 mg 100 mL/hr over 90 Minutes Intravenous  Once 10/05/18 1924 10/05/18 2040      Sch Meds:  Scheduled Meds: . ARIPiprazole  5 mg Oral Daily  . diltiazem  30 mg Oral Q6H  . enoxaparin (LOVENOX) injection  120 mg Subcutaneous Q12H  . insulin aspart  0-20 Units Subcutaneous TID WC  . insulin aspart  0-5 Units Subcutaneous QHS  . insulin aspart  8 Units Subcutaneous TID WC  . insulin glargine  20 Units Subcutaneous BID  . mouth rinse  15 mL Mouth Rinse BID  . methylPREDNISolone (SOLU-MEDROL) injection  40 mg Intravenous Q6H  . metoprolol tartrate  50 mg Oral BID  . mometasone-formoterol  2 puff Inhalation BID  . oxcarbazepine  1,200 mg Oral QHS  . sertraline  50 mg Oral Daily   Continuous Infusions:  PRN Meds:.  Carma LeavenWilliam Todd Jelinski [facility]  If 7PM-7AM, please contact night-coverage [email or web address] Password [email or web address] 10/11/2018, 8:29 AM

## 2018-10-11 NOTE — Plan of Care (Signed)

## 2018-10-12 LAB — FERRITIN: Ferritin: 97 ng/mL (ref 11–307)

## 2018-10-12 LAB — C-REACTIVE PROTEIN: CRP: 1.6 mg/dL — ABNORMAL HIGH (ref <1.0)

## 2018-10-12 LAB — GLUCOSE, CAPILLARY
Glucose-Capillary: 270 mg/dL — ABNORMAL HIGH (ref 70–99)
Glucose-Capillary: 282 mg/dL — ABNORMAL HIGH (ref 70–99)

## 2018-10-12 LAB — D-DIMER, QUANTITATIVE: D-Dimer, Quant: 0.72 ug/mL-FEU — ABNORMAL HIGH (ref 0.00–0.50)

## 2018-10-12 MED ORDER — PREDNISONE 10 MG PO TABS: ORAL_TABLET | ORAL | 0 refills | Status: DC

## 2018-10-12 MED ORDER — DILTIAZEM HCL 30 MG PO TABS: 30.0000 mg | ORAL_TABLET | Freq: Four times a day (QID) | ORAL | 0 refills | Status: AC

## 2018-10-12 MED ORDER — APIXABAN 5 MG PO TABS: 5.0000 mg | ORAL_TABLET | Freq: Two times a day (BID) | ORAL | 0 refills | Status: DC

## 2018-10-12 MED ORDER — METOPROLOL TARTRATE 50 MG PO TABS: 50.0000 mg | ORAL_TABLET | Freq: Two times a day (BID) | ORAL | 0 refills | Status: AC

## 2018-10-12 NOTE — Progress Notes (Addendum)
0020  Pt sleeping.  No s/sx of distress noted.  CLWR.  0355  Pt sleeping.  No s/swx of distress.  CLWR.

## 2018-10-12 NOTE — Plan of Care (Signed)

## 2018-10-12 NOTE — Plan of Care (Signed)
  Problem: Education: Goal: Knowledge of General Education information will improve Description: Including pain rating scale, medication(s)/side effects and non-pharmacologic comfort measures 10/12/2018 1303 by Phyllis Ginger, RN Outcome: Adequate for Discharge 10/12/2018 1206 by Phyllis Ginger, RN Outcome: Progressing   Problem: Health Behavior/Discharge Planning: Goal: Ability to manage health-related needs will improve 10/12/2018 1303 by Phyllis Ginger, RN Outcome: Adequate for Discharge 10/12/2018 1206 by Phyllis Ginger, RN Outcome: Not Progressing   Problem: Clinical Measurements: Goal: Ability to maintain clinical measurements within normal limits will improve 10/12/2018 1303 by Phyllis Ginger, RN Outcome: Adequate for Discharge 10/12/2018 1206 by Phyllis Ginger, RN Outcome: Not Progressing Goal: Will remain free from infection Outcome: Adequate for Discharge Goal: Diagnostic test results will improve 10/12/2018 1303 by Phyllis Ginger, RN Outcome: Adequate for Discharge 10/12/2018 1206 by Phyllis Ginger, RN Outcome: Not Progressing Goal: Respiratory complications will improve 10/12/2018 1303 by Phyllis Ginger, RN Outcome: Adequate for Discharge 10/12/2018 1206 by Phyllis Ginger, RN Outcome: Not Progressing Goal: Cardiovascular complication will be avoided 10/12/2018 1303 by Phyllis Ginger, RN Outcome: Adequate for Discharge 10/12/2018 1206 by Phyllis Ginger, RN Outcome: Not Progressing   Problem: Activity: Goal: Risk for activity intolerance will decrease 10/12/2018 1303 by Phyllis Ginger, RN Outcome: Adequate for Discharge 10/12/2018 1206 by Phyllis Ginger, RN Outcome: Not Progressing   Problem: Nutrition: Goal: Adequate nutrition will be maintained 10/12/2018 1303 by Phyllis Ginger, RN Outcome: Adequate for Discharge 10/12/2018 1206 by Phyllis Ginger, RN Outcome: Not Progressing   Problem: Coping: Goal: Level of anxiety will decrease 10/12/2018 1303 by  Phyllis Ginger, RN Outcome: Adequate for Discharge 10/12/2018 1206 by Phyllis Ginger, RN Outcome: Not Progressing   Problem: Elimination: Goal: Will not experience complications related to bowel motility 10/12/2018 1303 by Phyllis Ginger, RN Outcome: Adequate for Discharge 10/12/2018 1206 by Phyllis Ginger, RN Outcome: Not Progressing Goal: Will not experience complications related to urinary retention 10/12/2018 1303 by Phyllis Ginger, RN Outcome: Adequate for Discharge 10/12/2018 1206 by Phyllis Ginger, RN Outcome: Not Progressing   Problem: Pain Managment: Goal: General experience of comfort will improve 10/12/2018 1303 by Phyllis Ginger, RN Outcome: Adequate for Discharge 10/12/2018 1206 by Phyllis Ginger, RN Outcome: Not Progressing   Problem: Safety: Goal: Ability to remain free from injury will improve 10/12/2018 1303 by Phyllis Ginger, RN Outcome: Adequate for Discharge 10/12/2018 1206 by Phyllis Ginger, RN Outcome: Not Progressing   Problem: Skin Integrity: Goal: Risk for impaired skin integrity will decrease 10/12/2018 1303 by Phyllis Ginger, RN Outcome: Adequate for Discharge 10/12/2018 1206 by Phyllis Ginger, RN Outcome: Not Progressing

## 2018-10-12 NOTE — Plan of Care (Signed)
  Problem: Education: Goal: Knowledge of General Education information will improve Description: Including pain rating scale, medication(s)/side effects and non-pharmacologic comfort measures Outcome: Progressing   Problem: Health Behavior/Discharge Planning: Goal: Ability to manage health-related needs will improve Outcome: Not Progressing   Problem: Clinical Measurements: Goal: Ability to maintain clinical measurements within normal limits will improve Outcome: Not Progressing Goal: Diagnostic test results will improve Outcome: Not Progressing Goal: Respiratory complications will improve Outcome: Not Progressing Goal: Cardiovascular complication will be avoided Outcome: Not Progressing   Problem: Activity: Goal: Risk for activity intolerance will decrease Outcome: Not Progressing   Problem: Nutrition: Goal: Adequate nutrition will be maintained Outcome: Not Progressing   Problem: Coping: Goal: Level of anxiety will decrease Outcome: Not Progressing   Problem: Elimination: Goal: Will not experience complications related to bowel motility Outcome: Not Progressing Goal: Will not experience complications related to urinary retention Outcome: Not Progressing   Problem: Pain Managment: Goal: General experience of comfort will improve Outcome: Not Progressing   Problem: Safety: Goal: Ability to remain free from injury will improve Outcome: Not Progressing   Problem: Skin Integrity: Goal: Risk for impaired skin integrity will decrease Outcome: Not Progressing

## 2018-10-12 NOTE — Progress Notes (Addendum)
0915  Resting O2 sat on room air is 99%.  Ambulated 350 ft.  O2 sat decreased to 95%.  Denies any shortness of breath or dizziness.  Back to room safely.  O2 sat returned to 99% when sitting in chair.  1400  Discharge instructions reviewed with patient.  All questions answered.  IV removed intact.  Awaiting ride home.

## 2018-10-12 NOTE — Discharge Instructions (Addendum)
Community-Acquired Pneumonia, Adult °Pneumonia is a type of lung infection that causes swelling in the airways of the lungs. Mucus and fluid may also build up inside the airways. This may cause coughing and difficulty breathing. °There are different types of pneumonia. One type can develop while a person is in a hospital. A different type is called community-acquired pneumonia. It develops in people who are not, and have not recently been, in the hospital or another type of health care facility. °What are the causes? °This condition may be caused by: °· Viruses. This is the most common cause of pneumonia. °· Bacteria. Community-acquired pneumonia is often caused by Streptococcus pneumoniae bacteria. These bacteria are often passed from one person to another by breathing in droplets from the cough or sneeze of an infected person. °· Fungi. This is the least common cause of pneumonia. °What increases the risk? °The following factors may make you more likely to develop this condition: °· Having a chronic disease, such as chronic obstructive pulmonary disease (COPD), asthma, congestive heart failure, cystic fibrosis, diabetes, or kidney disease. °· Having early-stage or late-stage HIV. °· Having sickle cell disease. °· Having had your spleen removed (splenectomy). °· Having poor dental hygiene. °· Having a medical condition that increases the risk of breathing in (aspirating) secretions from your own mouth and nose. °· Having a weakened body defense system (immune system). °· Being a smoker. °· Traveling to areas where pneumonia-causing germs commonly exist. °· Being around animal habitats or animals that have pneumonia-causing germs, including birds, bats, rabbits, cats, and farm animals. °What are the signs or symptoms? °Symptoms of this condition include: °· A dry cough. °· A wet (productive) cough. °· Fever. °· Sweating. °· Chest pain, especially when breathing deeply or coughing. °· Rapid breathing or difficulty  breathing. °· Shortness of breath. °· Shaking chills. °· Fatigue. °· Muscle aches. °How is this diagnosed? °This condition may be diagnosed based on: °· Your medical history. °· A physical exam. °You may also have tests, including: °· Chest X-rays. °· Tests of your blood oxygen level and other blood gases. °· Tests on blood, mucus (sputum), fluid around your lungs (pleural fluid), and urine. °If your pneumonia is severe, other tests may be done to find the exact cause of your illness. °How is this treated? °Treatment for this condition depends on many factors, such as the cause of your pneumonia, the medicines you take, and other medical conditions that you have. °For most adults, treatment and recovery from pneumonia may occur at home. In some cases, treatment must happen in a hospital. Treatment may include: °· Medicines that are given by mouth or through an IV, including: °? Antibiotic medicines, if the pneumonia was caused by bacteria. °? Antiviral medicines, if the pneumonia was caused by a virus. °· Being given extra oxygen. °· Respiratory therapy. °Although rare, treating severe pneumonia may include: °· Using a machine to help you breathe (mechanical ventilation). This is done if you are not breathing well on your own and you cannot maintain a safe blood oxygen level. °· Thoracentesis. This is a procedure to remove fluid from around one lung or both lungs to help you breathe better. °Follow these instructions at home: ° °Medicines °· Take over-the-counter and prescription medicines only as told by your health care provider. °? Only take cough medicine if you are losing sleep. Be aware that cough medicine can prevent your body's natural ability to remove mucus from your lungs. °· If you were prescribed an antibiotic   medicine, take it as told by your health care provider. Do not stop taking the antibiotic even if you start to feel better. General instructions  Sleep in a semi-upright position at night. Try  sleeping in a reclining chair, or place a few pillows under your head.  Rest as needed and get at least 8 hours of sleep each night.  Drink enough water to keep your urine pale yellow. This will help to thin out mucus secretions in your lungs.  Eat a healthy diet that includes plenty of vegetables, fruits, whole grains, low-fat dairy products, and lean protein.  Do not use any products that contain nicotine or tobacco, such as cigarettes, e-cigarettes, and chewing tobacco. If you need help quitting, ask your health care provider.  Keep all follow-up visits as told by your health care provider. This is important. How is this prevented? You can lower your risk of developing community-acquired pneumonia by:  Getting a pneumococcal vaccine. There are different types and schedules of pneumococcal vaccines. Ask your health care provider which option is best for you. Consider getting the vaccine if: ? You are older than 56 years of age. ? You are older than 56 years of age and are undergoing cancer treatment, have chronic lung disease, or have other medical conditions that affect your immune system. Ask your health care provider if this applies to you.  Getting an influenza vaccine every year. Ask your health care provider which type of vaccine is best for you.  Getting regular checkups from your dentist.  Washing your hands often. If soap and water are not available, use hand sanitizer. Contact a health care provider if:  You have a fever.  You are losing sleep because you cannot control your cough with cough medicine. Get help right away if:  You have worsening shortness of breath.  You have increased chest pain.  Your sickness becomes worse, especially if you are an older adult or have a weakened immune system.  You cough up blood. Summary  Pneumonia is an infection of the lungs.  Community-acquired pneumonia develops in people who have not been in the hospital. It can be caused  by bacteria, viruses, or fungi.  This condition may be treated with antibiotics or antiviral medicines.  Severe cases may require hospitalization, mechanical ventilation, and other procedures to drain fluid from the lungs. This information is not intended to replace advice given to you by your health care provider. Make sure you discuss any questions you have with your health care provider. Document Released: 03/13/2005 Document Revised: 11/08/2017 Document Reviewed: 11/08/2017 Elsevier Patient Education  2020 [employer].   COVID-19 COVID-19 is a respiratory infection that is caused by a virus called severe acute respiratory syndrome coronavirus 2 (SARS-CoV-2). The disease is also known as coronavirus disease or novel coronavirus. In some people, the virus may not cause any symptoms. In others, it may cause a serious infection. The infection can get worse quickly and can lead to complications, such as:  Pneumonia, or infection of the lungs.  Acute respiratory distress syndrome or ARDS. This is fluid build-up in the lungs.  Acute respiratory failure. This is a condition in which there is not enough oxygen passing from the lungs to the body.  Sepsis or septic shock. This is a serious bodily reaction to an infection.  Blood clotting problems.  Secondary infections due to bacteria or fungus. The virus that causes COVID-19 is contagious. This means that it can spread from person to person through  droplets from coughs and sneezes (respiratory secretions). What are the causes? This illness is caused by a virus. You may catch the virus by:  Breathing in droplets from an infected person's cough or sneeze.  Touching something, like a table or a doorknob, that was exposed to the virus (contaminated) and then touching your mouth, nose, or eyes. What increases the risk? Risk for infection You are more likely to be infected with this virus if you:  Live in or travel to an area with a COVID-19  outbreak.  Come in contact with a sick person who recently traveled to an area with a COVID-19 outbreak.  Provide care for or live with a person who is infected with COVID-19. Risk for serious illness You are more likely to become seriously ill from the virus if you:  Are 27 years of age or older.  Have a long-term disease that lowers your body's ability to fight infection (immunocompromised).  Live in a nursing home or long-term care facility.  Have a long-term (chronic) disease such as: ? Chronic lung disease, including chronic obstructive pulmonary disease or asthma ? Heart disease. ? Diabetes. ? Chronic kidney disease. ? Liver disease.  Are obese. What are the signs or symptoms? Symptoms of this condition can range from mild to severe. Symptoms may appear any time from 2 to 14 days after being exposed to the virus. They include:  A fever.  A cough.  Difficulty breathing.  Chills.  Muscle pains.  A sore throat.  Loss of taste or smell. Some people may also have stomach problems, such as nausea, vomiting, or diarrhea. Other people may not have any symptoms of COVID-19. How is this diagnosed? This condition may be diagnosed based on:  Your signs and symptoms, especially if: ? You live in an area with a COVID-19 outbreak. ? You recently traveled to or from an area where the virus is common. ? You provide care for or live with a person who was diagnosed with COVID-19.  A physical exam.  Lab tests, which may include: ? A nasal swab to take a sample of fluid from your nose. ? A throat swab to take a sample of fluid from your throat. ? A sample of mucus from your lungs (sputum). ? Blood tests.  Imaging tests, which may include, X-rays, CT scan, or ultrasound. How is this treated? At present, there is no medicine to treat COVID-19. Medicines that treat other diseases are being used on a trial basis to see if they are effective against COVID-19. Your health care  provider will talk with you about ways to treat your symptoms. For most people, the infection is mild and can be managed at home with rest, fluids, and over-the-counter medicines. Treatment for a serious infection usually takes places in a hospital intensive care unit (ICU). It may include one or more of the following treatments. These treatments are given until your symptoms improve.  Receiving fluids and medicines through an IV.  Supplemental oxygen. Extra oxygen is given through a tube in the nose, a face mask, or a hood.  Positioning you to lie on your stomach (prone position). This makes it easier for oxygen to get into the lungs.  Continuous positive airway pressure (CPAP) or bi-level positive airway pressure (BPAP) machine. This treatment uses mild air pressure to keep the airways open. A tube that is connected to a motor delivers oxygen to the body.  Ventilator. This treatment moves air into and out of the lungs by  using a tube that is placed in your windpipe.  Tracheostomy. This is a procedure to create a hole in the neck so that a breathing tube can be inserted.  Extracorporeal membrane oxygenation (ECMO). This procedure gives the lungs a chance to recover by taking over the functions of the heart and lungs. It supplies oxygen to the body and removes carbon dioxide. Follow these instructions at home: Lifestyle  If you are sick, stay home except to get medical care. Your health care provider will tell you how long to stay home. Call your health care provider before you go for medical care.  Rest at home as told by your health care provider.  Do not use any products that contain nicotine or tobacco, such as cigarettes, e-cigarettes, and chewing tobacco. If you need help quitting, ask your health care provider.  Return to your normal activities as told by your health care provider. Ask your health care provider what activities are safe for you. General instructions  Take  over-the-counter and prescription medicines only as told by your health care provider.  Drink enough fluid to keep your urine pale yellow.  Keep all follow-up visits as told by your health care provider. This is important. How is this prevented?  There is no vaccine to help prevent COVID-19 infection. However, there are steps you can take to protect yourself and others from this virus. To protect yourself:   Do not travel to areas where COVID-19 is a risk. The areas where COVID-19 is reported change often. To identify high-risk areas and travel restrictions, check the CDC travel website: [employer].siwwwnc.cdc.gov/travel/notices  If you live in, or must travel to, an area where COVID-19 is a risk, take precautions to avoid infection. ? Stay away from people who are sick. ? Wash your hands often with soap and water for 20 seconds. If soap and water are not available, use an alcohol-based hand sanitizer. ? Avoid touching your mouth, face, eyes, or nose. ? Avoid going out in public, follow guidance from your state and local health authorities. ? If you must go out in public, wear a cloth face covering or face mask. ? Disinfect objects and surfaces that are frequently touched every day. This may include:  Counters and tables.  Doorknobs and light switches.  Sinks and faucets.  Electronics, such as phones, remote controls, keyboards, computers, and tablets. To protect others: If you have symptoms of COVID-19, take steps to prevent the virus from spreading to others.  If you think you have a COVID-19 infection, contact your health care provider right away. Tell your health care team that you think you may have a COVID-19 infection.  Stay home. Leave your house only to seek medical care. Do not use public transport.  Do not travel while you are sick.  Wash your hands often with soap and water for 20 seconds. If soap and water are not available, use alcohol-based hand sanitizer.  Stay away from  other members of your household. Let healthy household members care for children and pets, if possible. If you have to care for children or pets, wash your hands often and wear a mask. If possible, stay in your own room, separate from others. Use a different bathroom.  Make sure that all people in your household wash their hands well and often.  Cough or sneeze into a tissue or your sleeve or elbow. Do not cough or sneeze into your hand or into the air.  Wear a cloth face covering or  face mask. Where to find more information  Centers for Disease Control and Prevention: StickerEm[employer]ium.tnwww.cdc.gov/coronavirus/2019-ncov/index.html  World Health Organization: https://thompson-craig.com/www.who.int/health-topics/coronavirus Contact a health care provider if:  You live in or have traveled to an area where COVID-19 is a risk and you have symptoms of the infection.  You have had contact with someone who has COVID-19 and you have symptoms of the infection. Get help right away if:  You have trouble breathing.  You have pain or pressure in your chest.  You have confusion.  You have bluish lips and fingernails.  You have difficulty waking from sleep.  You have symptoms that get worse. These symptoms may represent a serious problem that is an emergency. Do not wait to see if the symptoms will go away. Get medical help right away. Call your local emergency services (911 in the [place] Do not drive yourself to the hospital. Let the emergency medical personnel know if you think you have COVID-19. Summary  COVID-19 is a respiratory infection that is caused by a virus. It is also known as coronavirus disease or novel coronavirus. It can cause serious infections, such as pneumonia, acute respiratory distress syndrome, acute respiratory failure, or sepsis.  The virus that causes COVID-19 is contagious. This means that it can spread from person to person through droplets from coughs and sneezes.  You are more likely to develop a serious  illness if you are 10865 years of age or older, have a weak immunity, live in a nursing home, or have chronic disease.  There is no medicine to treat COVID-19. Your health care provider will talk with you about ways to treat your symptoms.  Take steps to protect yourself and others from infection. Wash your hands often and disinfect objects and surfaces that are frequently touched every day. Stay away from people who are sick and wear a mask if you are sick. This information is not intended to replace advice given to you by your health care provider. Make sure you discuss any questions you have with your health care provider. Document Released: 04/18/2018 Document Revised: 08/08/2018 Document Reviewed: 04/18/2018 Elsevier Patient Education  2020 [employer]    Information on my medicine - ELIQUIS (apixaban)  This medication education was reviewed with me or my healthcare representative as part of my discharge preparation.    Why was Eliquis prescribed for you? Eliquis was prescribed for you to reduce the risk of a blood clot forming that can cause a stroke if you have a medical condition called atrial fibrillation (a type of irregular heartbeat).  What do You need to know about Eliquis ? Take your Eliquis TWICE DAILY - one tablet in the morning and one tablet in the evening with or without food. If you have difficulty swallowing the tablet whole please discuss with your pharmacist how to take the medication safely.  Take Eliquis exactly as prescribed by your doctor and DO NOT stop taking Eliquis without talking to the doctor who prescribed the medication.  Stopping may increase your risk of developing a stroke.  Refill your prescription before you run out.  After discharge, you should have regular check-up appointments with your healthcare provider that is prescribing your Eliquis.  In the future your dose may need to be changed if your kidney function or weight changes by a  significant amount or as you get older.  What do you do if you miss a dose? If you miss a dose, take it as soon as you remember on the  same day and resume taking twice daily.  Do not take more than one dose of ELIQUIS at the same time to make up a missed dose.  Important Safety Information A possible side effect of Eliquis is bleeding. You should call your healthcare provider right away if you experience any of the following: ? Bleeding from an injury or your nose that does not stop. ? Unusual colored urine (red or dark brown) or unusual colored stools (red or black). ? Unusual bruising for unknown reasons. ? A serious fall or if you hit your head (even if there is no bleeding).  Some medicines may interact with Eliquis and might increase your risk of bleeding or clotting while on Eliquis. To help avoid this, consult your healthcare provider or pharmacist prior to using any new prescription or non-prescription medications, including herbals, vitamins, non-steroidal anti-inflammatory drugs (NSAIDs) and supplements.  This website has more information on Eliquis (apixaban): http://www.eliquis.com/eliquis/home

## 2018-10-12 NOTE — Discharge Summary (Signed)
Physician Discharge Summary  Lauren Cummings WUJ:[id number] DOB: October 24, 1960 DOA: 10/05/2018  PCP: Marcine MatarJohnson, Deborah B, MD  Admit date: 10/05/2018 Discharge date: 10/12/2018  Admitted From: Home Disposition: Home  Recommendations for Outpatient Follow-up:  1. Follow up with PCP in 3-4 weeks as scheduled once Medicaid has been established 2. Please obtain BMP/CBC in one week  Discharge Condition: Stable CODE STATUS: Full Diet recommendation: Diabetic, low-carb low-salt low-fat diet.  Brief/Interim Summary: 58 year old female with schizoaffective disorder, hypertension, CAD and recent COVID 19+ on 08/29/2018 presenting with left-sided chest pain and admitted for HCAP (RUL pneumonia), mild DKA and elevated liver enzymes. In ED, afebrile but hypotensive to 80/58.  Desaturated to 89% and started on nasal cannula.  COVID-19 positive.  WBC 22.3.  With bandemia and lymphopenia.  Sodium 131.  Bicarb 20.  Glucose 339.  Creatinine 1.31.  Anion gap 17. ALP 196. AST 80. ALT 71. Total bili 4.1. BNP 600. CK 45. CK-MB 8.5. LDH 200.  High-sensitivity troponin 13> 10.  Ferritin 414.  CRP 36.4.  Lactic acid 2.1.  Procalcitonin 6.37.  CTA RUL pneumonia. Started on aztreonam and azithromycin for RUL pneumonia.  Started on IV fluid and insulin drip for mild DKA  Patient admitted as above with acute shortness of breath, pleuritic chest pain and hypoxia.  Patient also noted to have questionable DKA given mild anion gap and elevated glucose.  Symptoms resolved quite quickly, [facility] of COVID positive swab however given patient's previous swab was positive nearly 5 weeks prior patient's COVID status is not likely playing a role in her current respiratory complaints.  Patient admitted with likely HCAP pneumonia as below with acute onset/not previously diagnosed A. fib with RVR.  Patient now rate controlled on new regimen, completed 5 days of antibiotic therapy, steroid taper, now ambulating  without hypoxia.  Unclear if patient's A. fib is considered provoked in the setting of pneumonia versus paroxysmal given no history in the past.  He discussion with patient and family about need for anticoagulation ongoing, patient has a chads vascular score of 6 dictating she is appropriate for anticoagulation. Her diabetes is moderately well controlled, otherwise patient chronic comorbid conditions as below are stable, patient will need close follow-up with PCP however she is new to the area and has not yet established care.  Patient does have an appointment pending however is waiting for her Medicaid insurance to formalize prior to visiting physician for coverage.  At this time patient is otherwise stable and agreeable for discharge, lengthy discussion at bedside about need for ongoing medication compliance and close monitoring given her symptoms.  Discharge Diagnoses:  Principal Problem:   HCAP (healthcare-associated pneumonia) Active Problems:   COVID-19 virus infection   Diabetes mellitus type 2, uncontrolled, without complications (HCC)   Essential hypertension   Bipolar disorder (HCC)   Abnormal liver function   Chest pain   AF (paroxysmal atrial fibrillation) (HCC)   Acute CHF (congestive heart failure) (HCC)   Allergies as of 10/12/2018      Reactions   Banana Anaphylaxis   Coconut Flavor Anaphylaxis   Keflex [cephalexin] Anaphylaxis   Penicillins Anaphylaxis   Did it involve swelling of the face/tongue/throat, SOB, or low BP? yes Did it involve sudden or severe rash/hives, skin peeling, or any reaction on the inside of your mouth or nose? no Did you need to seek medical attention at a hospital or doctor's office? yes When did it last happen?8 years ago If all above  answers are "NO", may proceed with cephalosporin use.   Pineapple Anaphylaxis   Other Hives   All berries cause hives      Medication List    STOP taking these medications   amLODipine 10 MG  tablet Commonly known as: NORVASC   aspirin EC 81 MG tablet   naproxen sodium 220 MG tablet Commonly known as: ALEVE     TAKE these medications   Admelog 100 UNIT/ML injection Generic drug: insulin lispro Inject 12 Units into the skin 3 (three) times daily with meals.   Adult One Daily Gummies Chew Chew 1 tablet by mouth daily.   apixaban 5 MG Tabs tablet Commonly known as: Eliquis Take 1 tablet (5 mg total) by mouth 2 (two) times daily.   ARIPiprazole 5 MG tablet Commonly known as: ABILIFY Take 1 tablet (5 mg total) by mouth daily.   b complex vitamins tablet Take 1 tablet by mouth daily.   diazepam 5 MG tablet Commonly known as: VALIUM Take 5 mg by mouth every 12 (twelve) hours as needed for anxiety.   diltiazem 30 MG tablet Commonly known as: CARDIZEM Take 1 tablet (30 mg total) by mouth 4 (four) times daily.   doxepin 25 MG capsule Commonly known as: SINEQUAN Take 1 capsule (25 mg total) by mouth at bedtime.   EpiPen 2-Pak 0.3 mg/0.3 mL Soaj injection Generic drug: EPINEPHrine Inject 0.3 mg into the muscle once as needed for anaphylaxis (severe allergic reaction).   FLAX SEEDS PO Take 1 capsule by mouth daily.   Fluticasone-Salmeterol 250-50 MCG/DOSE Aepb Commonly known as: ADVAIR Inhale 1 puff into the lungs 2 (two) times daily.   hydrOXYzine 50 MG tablet Commonly known as: ATARAX/VISTARIL Take 2 tablets (100 mg total) by mouth at bedtime.   insulin glargine 100 UNIT/ML injection Commonly known as: LANTUS Inject 0.2 mLs (20 Units total) into the skin 2 (two) times daily.   metFORMIN 1000 MG tablet Commonly known as: GLUCOPHAGE Take 1 tablet (1,000 mg total) by mouth 2 (two) times daily with a meal.   metoprolol tartrate 50 MG tablet Commonly known as: LOPRESSOR Take 1 tablet (50 mg total) by mouth 2 (two) times daily.   oxcarbazepine 600 MG tablet Commonly known as: TRILEPTAL Take 2 tablets (1,200 mg total) by mouth at bedtime.   predniSONE  10 MG tablet Commonly known as: DELTASONE Take 4 tablets (40 mg total) by mouth daily for 3 days, THEN 3 tablets (30 mg total) daily for 3 days, THEN 2 tablets (20 mg total) daily for 3 days, THEN 1 tablet (10 mg total) daily for 3 days. Start taking on: October 12, 2018   sertraline 100 MG tablet Commonly known as: ZOLOFT Take 1 tablet (100 mg total) by mouth daily.   Steglatro 5 MG Tabs Generic drug: Ertugliflozin L-PyroglutamicAc Take 5 mg by mouth daily.   ziprasidone 60 MG capsule Commonly known as: GEODON Take 1 capsule (60 mg total) by mouth 2 (two) times daily with a meal.       Allergies  Allergen Reactions  . Banana Anaphylaxis  . Coconut Flavor Anaphylaxis  . Keflex [Cephalexin] Anaphylaxis  . Penicillins Anaphylaxis    Did it involve swelling of the face/tongue/throat, SOB, or low BP? yes Did it involve sudden or severe rash/hives, skin peeling, or any reaction on the inside of your mouth or nose? no Did you need to seek medical attention at a hospital or doctor's office? yes When did it last happen?8 years ago If  all above answers are "NO", may proceed with cephalosporin use.  Marland Kitchen. Pineapple Anaphylaxis  . Other Hives    All berries cause hives   Procedures/Studies: Ct Angio Chest Pe W And/or Wo Contrast  Result Date: 10/05/2018 CLINICAL DATA:  Chest pain, diaphoresis, hypotension, tachycardia. COVID positive 1 month ago, now cleared to return to work. EXAM: CT ANGIOGRAPHY CHEST CT ABDOMEN AND PELVIS WITH CONTRAST TECHNIQUE: Multidetector CT imaging of the chest was performed using the standard protocol during bolus administration of intravenous contrast. Multiplanar CT image reconstructions and MIPs were obtained to evaluate the vascular anatomy. Multidetector CT imaging of the abdomen and pelvis was performed using the standard protocol during bolus administration of intravenous contrast. CONTRAST:  100mL OMNIPAQUE IOHEXOL 350 MG/ML SOLN COMPARISON:  None.  FINDINGS: CTA CHEST FINDINGS Cardiovascular: Preferential opacification of the bilateral pulmonary arteries to the segmental level. No evidence of pulmonary embolism. No evidence of thoracic aortic aneurysm. The heart is top-normal in size.  No pericardial effusion. Mediastinum/Nodes: 13 mm short axis right paratracheal node (series 5/image 43). 11 mm short axis subcarinal node (series 5/image 56). 2.0 cm short axis right hilar node (series 5/image 50). Sixteen mm short axis right infrahilar node (series 5/image 62). Visualized thyroid is unremarkable. Lungs/Pleura: Consolidation with air bronchograms in the anterior right upper lobe (series 6/image 52), compatible with pneumonia. Mild subpleural nodularity in the right upper and middle lobe measuring 3-5 mm (series 6/images 71 and 76), likely benign. Mild dependent atelectasis in the bilateral lower lobes. Mild platelike scarring/atelectasis in the right middle lobe and lingula. No pleural effusion or pneumothorax. Musculoskeletal: Mild degenerative changes of the lower thoracic spine. Review of the MIP images confirms the above findings. CT ABDOMEN and PELVIS FINDINGS Hepatobiliary: Liver is within normal limits. Gallbladder is unremarkable. No intrahepatic or extrahepatic ductal dilatation. Pancreas: Within normal limits. Spleen: Within normal limits. Adrenals/Urinary Tract: Adrenal glands are within normal limits. Mild right renal cortical scarring. 2.1 cm medial right upper pole renal cyst (series 17/image 11). Left kidney is within normal limits. No hydronephrosis. Bladder is underdistended but unremarkable. Stomach/Bowel: Stomach is within normal limits. No evidence of bowel obstruction. Prior appendectomy. Vascular/Lymphatic: No evidence of abdominal aortic aneurysm. No suspicious abdominopelvic lymphadenopathy. Reproductive: Status post hysterectomy. No adnexal masses. Other: No abdominopelvic ascites. Musculoskeletal: Degenerative changes at L5-S1. Review  of the MIP images confirms the above findings. IMPRESSION: No evidence of pulmonary embolism. Anterior right upper lobe pneumonia. While this may reflect bronchopneumonia, given the patient's clinical history, correlate for pending COVID test results. Unremarkable CT abdomen/pelvis. Electronically Signed   By: Charline BillsSriyesh  Krishnan M.D.   On: 10/05/2018 19:18   Ct Abdomen Pelvis W Contrast  Result Date: 10/05/2018 CLINICAL DATA:  Chest pain, diaphoresis, hypotension, tachycardia. COVID positive 1 month ago, now cleared to return to work. EXAM: CT ANGIOGRAPHY CHEST CT ABDOMEN AND PELVIS WITH CONTRAST TECHNIQUE: Multidetector CT imaging of the chest was performed using the standard protocol during bolus administration of intravenous contrast. Multiplanar CT image reconstructions and MIPs were obtained to evaluate the vascular anatomy. Multidetector CT imaging of the abdomen and pelvis was performed using the standard protocol during bolus administration of intravenous contrast. CONTRAST:  100mL OMNIPAQUE IOHEXOL 350 MG/ML SOLN COMPARISON:  None. FINDINGS: CTA CHEST FINDINGS Cardiovascular: Preferential opacification of the bilateral pulmonary arteries to the segmental level. No evidence of pulmonary embolism. No evidence of thoracic aortic aneurysm. The heart is top-normal in size.  No pericardial effusion. Mediastinum/Nodes: 13 mm short axis right paratracheal node (series  5/image 43). 11 mm short axis subcarinal node (series 5/image 56). 2.0 cm short axis right hilar node (series 5/image 50). Sixteen mm short axis right infrahilar node (series 5/image 62). Visualized thyroid is unremarkable. Lungs/Pleura: Consolidation with air bronchograms in the anterior right upper lobe (series 6/image 52), compatible with pneumonia. Mild subpleural nodularity in the right upper and middle lobe measuring 3-5 mm (series 6/images 71 and 76), likely benign. Mild dependent atelectasis in the bilateral lower lobes. Mild platelike  scarring/atelectasis in the right middle lobe and lingula. No pleural effusion or pneumothorax. Musculoskeletal: Mild degenerative changes of the lower thoracic spine. Review of the MIP images confirms the above findings. CT ABDOMEN and PELVIS FINDINGS Hepatobiliary: Liver is within normal limits. Gallbladder is unremarkable. No intrahepatic or extrahepatic ductal dilatation. Pancreas: Within normal limits. Spleen: Within normal limits. Adrenals/Urinary Tract: Adrenal glands are within normal limits. Mild right renal cortical scarring. 2.1 cm medial right upper pole renal cyst (series 17/image 11). Left kidney is within normal limits. No hydronephrosis. Bladder is underdistended but unremarkable. Stomach/Bowel: Stomach is within normal limits. No evidence of bowel obstruction. Prior appendectomy. Vascular/Lymphatic: No evidence of abdominal aortic aneurysm. No suspicious abdominopelvic lymphadenopathy. Reproductive: Status post hysterectomy. No adnexal masses. Other: No abdominopelvic ascites. Musculoskeletal: Degenerative changes at L5-S1. Review of the MIP images confirms the above findings. IMPRESSION: No evidence of pulmonary embolism. Anterior right upper lobe pneumonia. While this may reflect bronchopneumonia, given the patient's clinical history, correlate for pending COVID test results. Unremarkable CT abdomen/pelvis. Electronically Signed   By: Charline Bills M.D.   On: 10/05/2018 19:18   Dg Chest Port 1 View  Result Date: 10/06/2018 CLINICAL DATA:  Dyspnea. Pneumonia due to COVID-19 virus. EXAM: PORTABLE CHEST 1 VIEW COMPARISON:  10/05/2018 FINDINGS: Increased heart size since prior study. Low lung volumes again noted. Worsening consolidation is seen in the inferior aspect of the right upper lobe. Increased airspace opacity is also seen in both lower lobes with associated air bronchograms. No evidence of pleural effusion. IMPRESSION: Worsening right upper lobe consolidation and increased bilateral  lower lobe airspace disease. Increased heart size. Electronically Signed   By: Danae Orleans M.D.   On: 10/06/2018 13:37   Dg Chest Port 1 View  Result Date: 10/05/2018 CLINICAL DATA:  Chest pain for 2 days EXAM: PORTABLE CHEST 1 VIEW COMPARISON:  08/29/2018 FINDINGS: Cardiac shadow is stable. Atelectatic changes in the right base are again identified. Patient is rotated to the right somewhat accentuating the mediastinal markings. Previously seen patchy opacities have predominately resolved in the interval from the prior exam. IMPRESSION: Improved aeration in the lungs bilaterally. Some persistent right basilar atelectasis is noted. Electronically Signed   By: Alcide Clever M.D.   On: 10/05/2018 18:43    Subjective: Acute issues or events overnight, patient able to ambulate today without hypoxia, heart rate remains controlled, glucose moderately well controlled on current regimen.  Patient otherwise stable and agreeable for discharge as above.   Discharge Exam: Vitals:   10/12/18 0543 10/12/18 0814  BP: 96/67 108/63  Pulse:  68  Resp:  18  Temp: 98.2 F (36.8 C) 98.5 F (36.9 C)  SpO2:  95%   Vitals:   10/11/18 2055 10/11/18 2224 10/12/18 0543 10/12/18 0814  BP:  107/60 96/67 108/63  Pulse:  75  68  Resp:    18  Temp: 98.6 F (37 C)  98.2 F (36.8 C) 98.5 F (36.9 C)  TempSrc: Oral  Oral Oral  SpO2:  95%  Weight:   119.3 kg   Height:        General:  Pleasantly resting in bed, No acute distress. HEENT:  Normocephalic atraumatic.  Sclerae nonicteric, noninjected.  Extraocular movements intact bilaterally. Neck:  Without mass or deformity.  Trachea is midline. Lungs:  Clear to auscultate bilaterally without rhonchi, wheeze, or rales. Heart:  Regular rate and rhythm.  Without murmurs, rubs, or gallops. Abdomen:  Soft, nontender, nondistended.  Without guarding or rebound. Extremities: Without cyanosis, clubbing, edema, or obvious deformity. Vascular:  Dorsalis pedis and  posterior tibial pulses palpable bilaterally. Skin:  Warm and dry, no erythema, no ulcerations.   The results of significant diagnostics from this hospitalization (including imaging, microbiology, ancillary and laboratory) are listed below for reference.     Microbiology: Recent Results (from the past 240 hour(s))  SARS Coronavirus 2 (CEPHEID - Performed in [facility] hospital lab), Hosp Order     Status: Abnormal   Collection Time: 10/05/18  5:40 PM   Specimen: Nasopharyngeal Swab  Result Value Ref Range Status   SARS Coronavirus 2 POSITIVE (A) NEGATIVE Final    Comment: RESULT CALLED TO, READ BACK BY AND VERIFIED WITH: RN Pattricia Boss [phone] (NOTE) If result is NEGATIVE SARS-CoV-2 target nucleic acids are NOT DETECTED. The SARS-CoV-2 RNA is generally detectable in upper and lower  respiratory specimens during the acute phase of infection. The lowest  concentration of SARS-CoV-2 viral copies this assay can detect is 250  copies / mL. A negative result does not preclude SARS-CoV-2 infection  and should not be used as the sole basis for treatment or other  patient management decisions.  A negative result may occur with  improper specimen collection / handling, submission of specimen other  than nasopharyngeal swab, presence of viral mutation(s) within the  areas targeted by this assay, and inadequate number of viral copies  (<250 copies / mL). A negative result must be combined with clinical  observations, patient history, and epidemiological information. If result is POSITIVE SARS-CoV-2 target nucleic acids are DETECTED. The SARS -CoV-2 RNA is generally detectable in upper and lower  respiratory specimens during the acute phase of infection.  Positive  results are indicative of active infection with SARS-CoV-2.  Clinical  correlation with patient history and other diagnostic information is  necessary to determine patient infection status.  Positive results do  not rule  out bacterial infection or co-infection with other viruses. If result is PRESUMPTIVE POSTIVE SARS-CoV-2 nucleic acids MAY BE PRESENT.   A presumptive positive result was obtained on the submitted specimen  and confirmed on repeat testing.  While 2019 novel coronavirus  (SARS-CoV-2) nucleic acids may be present in the submitted sample  additional confirmatory testing may be necessary for epidemiological  and / or clinical management purposes  to differentiate between  SARS-CoV-2 and other Sarbecovirus currently known to infect humans.  If clinically indicated additional testing with an alternate test  methodology [phone]) is advise d. The SARS-CoV-2 RNA is generally  detectable in upper and lower respiratory specimens during the acute  phase of infection. The expected result is Negative. Fact Sheet for Patients:  BoilerBrush.com.cy Fact Sheet for Healthcare Providers: https://pope.com/ This test is not yet approved or cleared by the [place] FDA and has been authorized for detection and/or diagnosis of SARS-CoV-2 by FDA under an Emergency Use Authorization (EUA).  This EUA will remain in effect (meaning this test can be used) for the duration of the COVID-19 declaration under  Section 564(b)(1) of the Act, 21 U.S.C. section 360bbb-3(b)(1), unless the authorization is terminated or revoked sooner. Performed at [facility], [address], [place], [place] [address]   Culture, blood (routine x 2)     Status: None   Collection Time: 10/05/18  8:21 PM   Specimen: BLOOD  Result Value Ref Range Status   Specimen Description BLOOD RIGHT ANTECUBITAL  Final   Special Requests   Final    BOTTLES DRAWN AEROBIC AND ANAEROBIC Blood Culture results may not be optimal due to an excessive volume of blood received in culture bottles   Culture   Final    NO GROWTH 5 DAYS Performed at [facility], [address], [place], [place]  [address]    Report Status 10/10/2018 FINAL  Final  Culture, blood (routine x 2)     Status: None   Collection Time: 10/06/18  7:20 AM   Specimen: BLOOD  Result Value Ref Range Status   Specimen Description BLOOD LEFT ANTECUBITAL  Final   Special Requests   Final    BOTTLES DRAWN AEROBIC ONLY Blood Culture results may not be optimal due to an inadequate volume of blood received in culture bottles   Culture   Final    NO GROWTH 5 DAYS Performed at [facility], [address], [place], [place] [address]    Report Status 10/11/2018 FINAL  Final  MRSA PCR Screening     Status: None   Collection Time: 10/08/18  6:07 PM   Specimen: Nasal Mucosa; Nasopharyngeal  Result Value Ref Range Status   MRSA by PCR NEGATIVE NEGATIVE Final    Comment:        The GeneXpert MRSA Assay (FDA approved for NASAL specimens only), is one component of a comprehensive MRSA colonization surveillance program. It is not intended to diagnose MRSA infection nor to guide or monitor treatment for MRSA infections. Performed at [facility], [address], [place], [place] [address]      Labs: BNP (last 3 results) Recent Labs    10/05/18 2020 10/06/18 1255  BNP 600.3* 1,575.5*   Basic Metabolic Panel: Recent Labs  Lab 10/06/18 1827 10/07/18 0645 10/08/18 0450 10/09/18 0530 10/10/18 0400  NA 133* 135 136 134* 138  K 4.1 3.7 3.3* 3.6 3.6  CL 102 100 100 100 103  CO2 17* GLUCOSE 347* 333* 244* 229* 158*  BUN 21* 28* 46* 48* 42*  CREATININE 0.87 0.88 0.95 0.79 0.68  CALCIUM 9.1 9.3 8.6* 8.8* 8.8*  MG 1.8 2.0  --   --   --    Liver Function Tests: Recent Labs  Lab 10/06/18 0707 10/06/18 1255 10/07/18 0645 10/08/18 0450 10/09/18 0530 10/10/18 0400  AST 36  --  16 12* 12* 15  ALT 50*  --  36 ALKPHOS 169*  --  152* 144* 121 100  BILITOT 3.2* 2.9* 1.5* 0.8 0.5 0.6  PROT 7.0  --  6.7 6.4* 6.1* 6.3*  ALBUMIN 2.4*  --  2.1* 2.2* 2.2* 2.3*   No  results for input(s): LIPASE, AMYLASE in the last 168 hours. No results for input(s): AMMONIA in the last 168 hours. CBC: Recent Labs  Lab 10/06/18 0707 10/07/18 0645 10/08/18 0450 10/09/18 0530 10/10/18 0400  WBC 18.6* 22.7* 24.4* 18.3* 17.7*  NEUTROABS 17.0* 20.8* 22.4* 16.1* 14.9*  HGB 12.4 11.7* 11.3* 11.4* 11.4*  HCT 36.6 34.1* 33.8* 34.1*  35.3*  MCV 80.3 79.5* 80.1 80.2 81.5  PLT 318 406* 431* 498* 571*   Cardiac Enzymes: Recent Labs  Lab 10/05/18 2020 10/06/18 0707 10/07/18 0645 10/08/18 0450 10/09/18 0530 10/10/18 0400  CKTOTAL 45 44 46 23* 11* 10*  CKMB 8.5*  --   --   --   --   --    BNP: Invalid input(s): POCBNP CBG: Recent Labs  Lab 10/11/18 1239 10/11/18 1610 10/11/18 2053 10/12/18 0815 10/12/18 1141  GLUCAP 273* 261* 251* 270* 282*   D-Dimer Recent Labs    10/11/18 0230 10/12/18 0205  DDIMER 0.95* 0.72*   Hgb A1c No results for input(s): HGBA1C in the last 72 hours. Lipid Profile No results for input(s): CHOL, HDL, LDLCALC, TRIG, CHOLHDL, LDLDIRECT in the last 72 hours. Thyroid function studies No results for input(s): TSH, T4TOTAL, T3FREE, THYROIDAB in the last 72 hours.  Invalid input(s): FREET3 Anemia work up Recent Labs    10/11/18 0230 10/12/18 0205  FERRITIN 144 97   Urinalysis No results found for: COLORURINE, APPEARANCEUR, LABSPEC, PHURINE, GLUCOSEU, HGBUR, BILIRUBINUR, KETONESUR, PROTEINUR, UROBILINOGEN, NITRITE, LEUKOCYTESUR Sepsis Labs Invalid input(s): PROCALCITONIN,  WBC,  LACTICIDVEN Microbiology Recent Results (from the past 240 hour(s))  SARS Coronavirus 2 (CEPHEID - [facility]), Hosp Order     Status: Abnormal   Collection Time: 10/05/18  5:40 PM   Specimen: Nasopharyngeal Swab  Result Value Ref Range Status   SARS Coronavirus 2 POSITIVE (A) NEGATIVE Final    Comment: RESULT CALLED TO, READ BACK BY AND VERIFIED WITH: RN Pattricia Boss KENNEDY [phone] (NOTE) If result is  NEGATIVE SARS-CoV-2 target nucleic acids are NOT DETECTED. The SARS-CoV-2 RNA is generally detectable in upper and lower  respiratory specimens during the acute phase of infection. The lowest  concentration of SARS-CoV-2 viral copies this assay can detect is 250  copies / mL. A negative result does not preclude SARS-CoV-2 infection  and should not be used as the sole basis for treatment or other  patient management decisions.  A negative result may occur with  improper specimen collection / handling, submission of specimen other  than nasopharyngeal swab, presence of viral mutation(s) within the  areas targeted by this assay, and inadequate number of viral copies  (<250 copies / mL). A negative result must be combined with clinical  observations, patient history, and epidemiological information. If result is POSITIVE SARS-CoV-2 target nucleic acids are DETECTED. The SARS -CoV-2 RNA is generally detectable in upper and lower  respiratory specimens during the acute phase of infection.  Positive  results are indicative of active infection with SARS-CoV-2.  Clinical  correlation with patient history and other diagnostic information is  necessary to determine patient infection status.  Positive results do  not rule out bacterial infection or co-infection with other viruses. If result is PRESUMPTIVE POSTIVE SARS-CoV-2 nucleic acids MAY BE PRESENT.   A presumptive positive result was obtained on the submitted specimen  and confirmed on repeat testing.  While 2019 novel coronavirus  (SARS-CoV-2) nucleic acids may be present in the submitted sample  additional confirmatory testing may be necessary for epidemiological  and / or clinical management purposes  to differentiate between  SARS-CoV-2 and other Sarbecovirus currently known to infect humans.  If clinically indicated additional testing with an alternate test  methodology [phone] is advise d. The SARS-CoV-2 RNA is generally  detectable  in upper and lower respiratory specimens during the acute  phase of infection. The expected result  is Negative. Fact Sheet for Patients:  BoilerBrush.com.cy Fact Sheet for Healthcare Providers: https://pope.com/ This test is not yet approved or cleared by the [place] FDA and has been authorized for detection and/or diagnosis of SARS-CoV-2 by FDA under an Emergency Use Authorization (EUA).  This EUA will remain in effect (meaning this test can be used) for the duration of the COVID-19 declaration under Section 564(b)(1) of the Act, 21 U.S.C. section 360bbb-3(b)(1), unless the authorization is terminated or revoked sooner. Performed at [facility], [address], [place], [place] [address]   Culture, blood (routine x 2)     Status: None   Collection Time: 10/05/18  8:21 PM   Specimen: BLOOD  Result Value Ref Range Status   Specimen Description BLOOD RIGHT ANTECUBITAL  Final   Special Requests   Final    BOTTLES DRAWN AEROBIC AND ANAEROBIC Blood Culture results may not be optimal due to an excessive volume of blood received in culture bottles   Culture   Final    NO GROWTH 5 DAYS Performed at [facility], [address], [place], [place] [address]    Report Status 10/10/2018 FINAL  Final  Culture, blood (routine x 2)     Status: None   Collection Time: 10/06/18  7:20 AM   Specimen: BLOOD  Result Value Ref Range Status   Specimen Description BLOOD LEFT ANTECUBITAL  Final   Special Requests   Final    BOTTLES DRAWN AEROBIC ONLY Blood Culture results may not be optimal due to an inadequate volume of blood received in culture bottles   Culture   Final    NO GROWTH 5 DAYS Performed at [facility], [address] [place], [place] [address]    Report Status 10/11/2018 FINAL  Final  MRSA PCR Screening     Status: None   Collection Time: 10/08/18  6:07 PM   Specimen: Nasal Mucosa; Nasopharyngeal  Result Value  Ref Range Status   MRSA by PCR NEGATIVE NEGATIVE Final    Comment:        The GeneXpert MRSA Assay (FDA approved for NASAL specimens only), is one component of a comprehensive MRSA colonization surveillance program. It is not intended to diagnose MRSA infection nor to guide or monitor treatment for MRSA infections. Performed at [facility], [address], [place], [place] [address]      Time coordinating discharge: Over 30 minutes  SIGNED:  Azucena Fallen, DO [facility] 10/12/2018, 12:13 PM Pager   If 7PM-7AM, please contact night-coverage [email or web address] Password [email or web address]

## 2018-10-14 MED FILL — METOPROLOL TARTRATE 50 MG T: 50 | 15 days supply | Qty: 30 | Fill #0

## 2018-10-14 MED FILL — dilTIAZem HCL 30 MG TABS: 30 | 30 days supply | Qty: 120 | Fill #0

## 2018-10-14 MED FILL — !ELIQUIS 5MG TABLET: 5 | 30 days supply | Qty: 60 | Fill #0

## 2018-10-19 ENCOUNTER — Emergency Department (HOSPITAL_COMMUNITY)
Admission: EM | Admit: 2018-10-19 | Discharge: 2018-10-19 | Disposition: A | Discharge status: Home / Self Care | Payer: Medicaid Other | Attending: Emergency Medicine | Admitting: Emergency Medicine

## 2018-10-19 ENCOUNTER — Encounter (HOSPITAL_COMMUNITY): Payer: Self-pay

## 2018-10-19 ENCOUNTER — Other Ambulatory Visit: Payer: Self-pay

## 2018-10-19 DIAGNOSIS — Z7901 Long term (current) use of anticoagulants: Secondary | ICD-10-CM | POA: Diagnosis not present

## 2018-10-19 DIAGNOSIS — E119 Type 2 diabetes mellitus without complications: Secondary | ICD-10-CM | POA: Diagnosis not present

## 2018-10-19 DIAGNOSIS — Z880 Allergy status to penicillin: Secondary | ICD-10-CM | POA: Diagnosis not present

## 2018-10-19 DIAGNOSIS — I10 Essential (primary) hypertension: Secondary | ICD-10-CM | POA: Insufficient documentation

## 2018-10-19 DIAGNOSIS — Z79899 Other long term (current) drug therapy: Secondary | ICD-10-CM | POA: Insufficient documentation

## 2018-10-19 DIAGNOSIS — M722 Plantar fascial fibromatosis: Secondary | ICD-10-CM

## 2018-10-19 DIAGNOSIS — Z8619 Personal history of other infectious and parasitic diseases: Secondary | ICD-10-CM | POA: Diagnosis not present

## 2018-10-19 DIAGNOSIS — M79673 Pain in unspecified foot: Secondary | ICD-10-CM | POA: Diagnosis present

## 2018-10-19 DIAGNOSIS — Z794 Long term (current) use of insulin: Secondary | ICD-10-CM | POA: Insufficient documentation

## 2018-10-19 DIAGNOSIS — I252 Old myocardial infarction: Secondary | ICD-10-CM | POA: Diagnosis not present

## 2018-10-19 MED ORDER — IBUPROFEN 800 MG PO TABS: 800.0000 mg | ORAL_TABLET | Freq: Three times a day (TID) | ORAL | 0 refills | Status: AC | PRN

## 2018-10-19 MED ORDER — HYDROCODONE-ACETAMINOPHEN 5-325 MG PO TABS
1.0000 | ORAL_TABLET | Freq: Once | ORAL | Status: AC
  Administered 2018-10-19: 1 via ORAL
  Filled 2018-10-19: qty 1

## 2018-10-19 MED ORDER — TRAMADOL HCL 50 MG PO TABS: 50.0000 mg | ORAL_TABLET | Freq: Four times a day (QID) | ORAL | 0 refills | Status: AC | PRN

## 2018-10-19 NOTE — Discharge Instructions (Addendum)
Use ice on your feet I would also recommend doing stretching out your feet as well.  Return here as needed.  Follow-up with your doctor for recheck.

## 2018-10-19 NOTE — ED Triage Notes (Signed)
Per [facility] pt form home for bilat foot pains x 2 days. Pt is diabetic. CBG 300s. Vitals: 150/90, 90Hr, 97.9 temp Pt was recently treated for COVid.

## 2018-10-20 NOTE — ED Provider Notes (Signed)
[facility] DEPT Provider Note   CSN: [id number] Arrival date & time: 10/19/18  1527     History   Chief Complaint Chief Complaint  Patient presents with   Foot Pain    HPI Lauren Cummings is a 58 y.o. female.     HPI Patient presents to the emergency department with bilateral foot pain that is been ongoing for 2 days.  Patient states that she has pain that starts at the heel and spreads across the bottom of her foot.  Patient states she wears a lot of flat shoes.  Patient states that movements palpation and standing make the pain worse.  Patient denies any fever, back pain, nausea, vomiting, weakness, abdominal pain, headache, blurred vision or syncope. Past Medical History:  Diagnosis Date   Diabetes mellitus without complication ([place])    Hypertension    MI (myocardial infarction) ([place])    Schizophrenia [facility])     Patient Active Problem List   Diagnosis Date Noted   AF (paroxysmal atrial fibrillation) ([place]) 10/06/2018   Acute CHF (congestive heart failure) ([place]) 10/06/2018   HCAP (healthcare-associated pneumonia) 10/05/2018   Abnormal liver function 10/05/2018   Chest pain 10/05/2018   Diabetes mellitus type 2, uncontrolled, without complications ([place]) 03/50/0938   Essential hypertension 09/06/2018   Schizophrenia ([place]) 09/06/2018   Bipolar disorder ([place]) 09/06/2018   COVID-19 virus infection 08/29/2018   Hyperglycemia 08/29/2018    Past Surgical History:  Procedure Laterality Date   ABDOMINAL HYSTERECTOMY     APPENDECTOMY     hemoorhoid       OB History   No obstetric history on file.      Home Medications    Prior to Admission medications   Medication Sig Start Date End Date Taking? Authorizing Provider  apixaban (ELIQUIS) 5 MG TABS tablet Take 1 tablet (5 mg total) by mouth 2 (two) times daily. 10/12/18   Little Ishikawa, MD  ARIPiprazole (ABILIFY) 5 MG tablet Take 1 tablet (5 mg total) by mouth  daily. Patient not taking: Reported on 10/05/2018 09/09/18   Ladell Pier, MD  b complex vitamins tablet Take 1 tablet by mouth daily.    [provider]  diazepam (VALIUM) 5 MG tablet Take 5 mg by mouth every 12 (twelve) hours as needed for anxiety.    [provider]  diltiazem (CARDIZEM) 30 MG tablet Take 1 tablet (30 mg total) by mouth 4 (four) times daily. 10/12/18 11/11/18  Little Ishikawa, MD  doxepin (SINEQUAN) 25 MG capsule Take 1 capsule (25 mg total) by mouth at bedtime. Patient not taking: Reported on 10/05/2018 09/09/18   Ladell Pier, MD  EPINEPHrine (EPIPEN 2-PAK) 0.3 mg/0.3 mL IJ SOAJ injection Inject 0.3 mg into the muscle once as needed for anaphylaxis (severe allergic reaction).    [provider]  Ertugliflozin L-PyroglutamicAc (STEGLATRO) 5 MG TABS Take 5 mg by mouth daily. Patient not taking: Reported on 10/05/2018 09/09/18   Ladell Pier, MD  Flaxseed, Linseed, (FLAX SEEDS PO) Take 1 capsule by mouth daily.     [provider]  Fluticasone-Salmeterol (ADVAIR) 250-50 MCG/DOSE AEPB Inhale 1 puff into the lungs 2 (two) times daily.    [provider]  hydrOXYzine (ATARAX/VISTARIL) 50 MG tablet Take 2 tablets (100 mg total) by mouth at bedtime. Patient not taking: Reported on 10/05/2018 09/09/18   Ladell Pier, MD  ibuprofen (ADVIL) 800 MG tablet Take 1 tablet (800 mg total) by mouth every 8 (  eight) hours as needed. 10/19/18   Quinton Voth, Cristal Deerhristopher, PA-C  insulin glargine (LANTUS) 100 UNIT/ML injection Inject 0.2 mLs (20 Units total) into the skin 2 (two) times daily. Patient not taking: Reported on 10/05/2018 09/09/18   Marcine MatarJohnson, Deborah B, MD  insulin lispro (ADMELOG) 100 UNIT/ML injection Inject 12 Units into the skin 3 (three) times daily with meals.    [provider]  metFORMIN (GLUCOPHAGE) 1000 MG tablet Take 1 tablet (1,000 mg total) by mouth 2 (two) times daily with a meal. Patient not taking: Reported  on 10/05/2018 09/09/18   Marcine MatarJohnson, Deborah B, MD  metoprolol tartrate (LOPRESSOR) 50 MG tablet Take 1 tablet (50 mg total) by mouth 2 (two) times daily. 10/12/18   Azucena FallenLancaster, William C, MD  Multiple Vitamins-Minerals (ADULT ONE DAILY GUMMIES) CHEW Chew 1 tablet by mouth daily.    [provider]  oxcarbazepine (TRILEPTAL) 600 MG tablet Take 2 tablets (1,200 mg total) by mouth at bedtime. Patient not taking: Reported on 10/05/2018 09/09/18   Marcine MatarJohnson, Deborah B, MD  predniSONE (DELTASONE) 10 MG tablet Take 4 tablets (40 mg total) by mouth daily for 3 days, THEN 3 tablets (30 mg total) daily for 3 days, THEN 2 tablets (20 mg total) daily for 3 days, THEN 1 tablet (10 mg total) daily for 3 days. 10/12/18 10/24/18  Azucena FallenLancaster, William C, MD  sertraline (ZOLOFT) 100 MG tablet Take 1 tablet (100 mg total) by mouth daily. Patient not taking: Reported on 10/05/2018 09/09/18   Marcine MatarJohnson, Deborah B, MD  traMADol (ULTRAM) 50 MG tablet Take 1 tablet (50 mg total) by mouth every 6 (six) hours as needed for severe pain. 10/19/18   Tunya Held, Cristal Deerhristopher, PA-C  ziprasidone (GEODON) 60 MG capsule Take 1 capsule (60 mg total) by mouth 2 (two) times daily with a meal. Patient not taking: Reported on 10/05/2018 09/09/18   Marcine MatarJohnson, Deborah B, MD    Family History Family History  Problem Relation Age of Onset   Diabetes Mellitus II Mother    Diabetes Mellitus II Father    Diabetes Mellitus II Brother    CAD Brother     Social History Social History   Tobacco Use   Smoking status: Never Smoker   Smokeless tobacco: Never Used  Substance Use Topics   Alcohol use: Never    Frequency: Never   Drug use: Not on file     Allergies   Banana, Coconut flavor, Keflex [cephalexin], Penicillins, Pineapple, and Other   Review of Systems Review of Systems All other systems negative except as documented in the HPI. All pertinent positives and negatives as reviewed in the HPI.  Physical Exam Updated Vital  Signs Ht 5\' 9"  (1.753 m)    Wt 108.9 kg    BMI 35.44 kg/m   Physical Exam Vitals signs and nursing note reviewed.  Constitutional:      General: She is not in acute distress.    Appearance: She is well-developed.  HENT:     Head: Normocephalic and atraumatic.  Eyes:     Pupils: Pupils are equal, round, and reactive to light.  Pulmonary:     Effort: Pulmonary effort is normal.  Musculoskeletal:       Feet:  Skin:    General: Skin is warm and dry.  Neurological:     Mental Status: She is alert and oriented to person, place, and time.      ED Treatments / Results  Labs (all labs ordered are listed, but only abnormal results  are displayed) Labs Reviewed - No data to display  EKG None  Radiology No results found.  Procedures Procedures (including critical care time)  Medications Ordered in ED Medications  HYDROcodone-acetaminophen (NORCO/VICODIN) 5-325 MG per tablet 1 tablet (1 tablet Oral Given 10/19/18 1712)     Initial Impression / Assessment and Plan / ED Course  I have reviewed the triage vital signs and the nursing notes.  Pertinent labs & imaging results that were available during my care of the patient were reviewed by me and considered in my medical decision making (see chart for details).       Patient be treated for plantar fasciitis and told to follow-up with her doctor.  Patient is advised to use heel cups and also wears shoes that offer good support.  She is also advised to use heat along with stretching exercises  Final Clinical Impressions(s) / ED Diagnoses   Final diagnoses:  Plantar fasciitis of right foot  Plantar fasciitis of left foot    ED Discharge Orders         Ordered    ibuprofen (ADVIL) 800 MG tablet  Every 8 hours PRN     10/19/18 1658    traMADol (ULTRAM) 50 MG tablet  Every 6 hours PRN     10/19/18 [address]           [place] Condon, PA-C 10/20/18 1530    Loren RacerYelverton, David, MD 10/20/18 2311

## 2018-10-21 ENCOUNTER — Encounter: Payer: Self-pay | Admitting: Internal Medicine

## 2018-10-21 ENCOUNTER — Other Ambulatory Visit: Payer: Self-pay

## 2018-10-21 ENCOUNTER — Ambulatory Visit: Payer: Medicaid Other | Attending: Internal Medicine | Admitting: Internal Medicine

## 2018-10-21 DIAGNOSIS — I480 Paroxysmal atrial fibrillation: Secondary | ICD-10-CM

## 2018-10-21 DIAGNOSIS — IMO0001 Reserved for inherently not codable concepts without codable children: Secondary | ICD-10-CM

## 2018-10-21 DIAGNOSIS — D649 Anemia, unspecified: Secondary | ICD-10-CM

## 2018-10-21 DIAGNOSIS — F209 Schizophrenia, unspecified: Secondary | ICD-10-CM

## 2018-10-21 DIAGNOSIS — F319 Bipolar disorder, unspecified: Secondary | ICD-10-CM

## 2018-10-21 DIAGNOSIS — Z598 Other problems related to housing and economic circumstances: Secondary | ICD-10-CM

## 2018-10-21 DIAGNOSIS — Z599 Problem related to housing and economic circumstances, unspecified: Secondary | ICD-10-CM

## 2018-10-21 DIAGNOSIS — E1165 Type 2 diabetes mellitus with hyperglycemia: Secondary | ICD-10-CM

## 2018-10-21 DIAGNOSIS — U071 COVID-19: Secondary | ICD-10-CM

## 2018-10-21 DIAGNOSIS — Y95 Nosocomial condition: Secondary | ICD-10-CM

## 2018-10-21 DIAGNOSIS — J189 Pneumonia, unspecified organism: Secondary | ICD-10-CM | POA: Diagnosis not present

## 2018-10-21 DIAGNOSIS — Z09 Encounter for follow-up examination after completed treatment for conditions other than malignant neoplasm: Secondary | ICD-10-CM

## 2018-10-21 DIAGNOSIS — F419 Anxiety disorder, unspecified: Secondary | ICD-10-CM

## 2018-10-21 DIAGNOSIS — M722 Plantar fascial fibromatosis: Secondary | ICD-10-CM

## 2018-10-21 MED ORDER — APIXABAN 5 MG PO TABS: 5.0000 mg | ORAL_TABLET | Freq: Two times a day (BID) | ORAL | 1 refills | Status: AC

## 2018-10-21 NOTE — Progress Notes (Signed)
Virtual Visit via Telephone Note Due to current restrictions/limitations of in-office visits due to the COVID-19 pandemic, this scheduled clinical appointment was converted to a telehealth visit  I connected with Lauren Cummings on 10/21/18 at 3:01 p.m by telephone and verified that I am speaking with the correct person using two identifiers. I am in my office.  The patient is at home.  Only the patient and myself participated in this encounter.  I discussed the limitations, risks, security and privacy concerns of performing an evaluation and management service by telephone and the availability of in person appointments. I also discussed with the patient that there may be a patient responsible charge related to this service. The patient expressed understanding and agreed to proceed.   History of Present Illness: hx of COVID pneumonia 08/2018, DM, HTN, asthma, schizophrenia and bipolar ds.   Patient last evaluated via telemedicine 09/06/2018 posthospitalization for COVID pneumonia.  Purpose of this visit is hospital follow-up.  Since last visit with me, patient hospitalized 7/11- 18/2020 with HCAP in [place], mild DKA, acute CHF with BNP of 600 and new atrial fibrillation.  Patient treated with antibiotics and prednisone while in hospital.  Discharged on prednisone taper.  Of note she did test positive for COVID again during this hospitalization and [facility].  CHAD2Vasc score is 6.  Question of left atrial fib was brought on in the setting of pneumonia versus paroxysmal but no prior history.  Decision was made to keep patient on anticoagulation moving forward  Pneumonia:  SOB and cough resolved.  Feels much better.  No fever since being home.  A.fib:  On Eliquis but about to run out.  She is not sure she would be able to continue to afford. -episodes of heart racing about 1-2 x a day.  Lasts about 5 mins.  Goes away when she sits down. Some dizziness if she stands too  quickly.  On Cardizem and Metoprolol which are new -no bruising or bleeding on Eliquis.  -TSH was normal.  Free T4 was mildly elevated -Patient states she was told in the hospital that she had renal failure and was told to mention this to me today.  I have looked at the last Chem-7 and GFR looks okay.  DM:  Checking BS QID.  I asked patient several times what her blood sugars were running but patient would move on to talking about other things without giving an answer.  She reports compliance with Lantus 20 units twice a day, short acting insulin, metformin.  Not sure if she has International aid/development workerteglatro.   BS this a.m was 109  Schizophrenia/bipolar/anxiety disorder: She is stressed about being out of her psychiatric medications.  I had refill them for her when we spoke last month.  However patient states that cost is a major issue.  She is still in the process of getting her Medicaid transferred [facility] .  She tells me that she has checked in with DSS and was told that she should have it approve by the end of next month.  Major concern today is of pain on the soles of both feet extending from the arch to the heels x5 days.  She was seen in the ER for this recently and was diagnosed with plantar fasciitis.  Given ibuprofen and tramadol to use as needed.  Denies any numbness or tingling.  Worse when she takes a hot shower and with ambulation.    Observations/Objective: Lab Results  Component  Value Date   HGBA1C 7.8 (H) 10/06/2018   Lab Results  Component Value Date   WBC 17.7 (H) 10/10/2018   HGB 11.4 (L) 10/10/2018   HCT 35.3 (L) 10/10/2018   MCV 81.5 10/10/2018   PLT 571 (H) 10/10/2018     Chemistry      Component Value Date/Time   NA 138 10/10/2018 0400   K 3.6 10/10/2018 0400   CL 103 10/10/2018 0400   CO2 23 10/10/2018 0400   BUN 42 (H) 10/10/2018 0400   CREATININE 0.68 10/10/2018 0400      Component Value Date/Time   CALCIUM 8.8 (L) 10/10/2018 0400   ALKPHOS 100 10/10/2018 0400    AST 15 10/10/2018 0400   ALT 20 10/10/2018 0400   BILITOT 0.6 10/10/2018 0400         Assessment and Plan: 1. Hospital discharge follow-up 2. HAP (hospital-acquired pneumonia) -Patient sounds clinically much improved.  Since she did test COVID positive again during this hospitalization, we will hold off on bringing her for an in person visit until the middle of next month  3. Diabetes mellitus type 2, uncontrolled, without complications (HCC) Q6P is 7.8. Again I did not get a good idea of what her blood sugars are currently running as patient wanted to talk about other things.  For now I will have her continue current dose of Lantus, Humalog and metformin  4. AF (paroxysmal atrial fibrillation) (HCC) We will plan to get echo and refer to cardiology once she has her Medicaid. Sounds as though she is having some palpitations still, but I will hold off on increasing metoprolol and carvedilol until I see what her blood pressure looks like. -I have sent a message to our clinical pharmacist for help with getting Eliquis -Check TSH/T4  5. Plantar fasciitis, bilateral Discussed diagnosis and management with patient.  I recommend heel exercises, purchase and use some Dr. Felicie Morn gel inserts in the heel of the shoes.  Also advised rubbing the heel over tennis ball or cold object when she is sitting watching TV.  Plan to refer to podiatry once she has Medicaid  6. Schizophrenia, unspecified type ([place]) 7. Bipolar affective disorder, remission status unspecified ([name]) 8. Anxiety disorder, unspecified type Message sent to our social worker to see if there is any programs out there that will help her to get her psychiatric medications until she has Medicaid  9. Financial difficulties  10.  Anemia Patient to come to the lab for CBC, iron studies  Follow Up Instructions: Next mth   I discussed the assessment and treatment plan with the patient. The patient was provided an opportunity to ask  questions and all were answered. The patient agreed with the plan and demonstrated an understanding of the instructions.   The patient was advised to call back or seek an in-person evaluation if the symptoms worsen or if the condition fails to improve as anticipated.  I provided 22 minutes of non-face-to-face time during this encounter.   Karle Plumber, MD

## 2018-10-21 NOTE — Progress Notes (Signed)
Hospital f/u  CBG this morning was 109 and 2 pm was 289

## 2018-11-11 ENCOUNTER — Other Ambulatory Visit: Payer: Medicaid - Out of State

## 2018-11-22 ENCOUNTER — Ambulatory Visit: Payer: Medicaid - Out of State | Admitting: Internal Medicine

## 2018-12-11 ENCOUNTER — Other Ambulatory Visit: Payer: Self-pay

## 2018-12-19 ENCOUNTER — Ambulatory Visit: Payer: Self-pay | Admitting: Internal Medicine

## 2019-01-13 ENCOUNTER — Other Ambulatory Visit: Payer: Self-pay

## 2019-01-20 ENCOUNTER — Ambulatory Visit: Payer: Self-pay | Admitting: Internal Medicine

## 2019-05-26 DEATH — deceased

## 2020-01-12 IMAGING — CT CT ANGIOGRAPHY CHEST
1 of 11 series · 2 of 16 positions shown · IV contrast (omnipaque)
Comparison: None.

CLINICAL DATA: Chest pain, diaphoresis, hypotension, tachycardia.
COVID positive 1 month ago, now cleared to return to work.

EXAM:
CT ANGIOGRAPHY CHEST
CT ABDOMEN AND PELVIS WITH CONTRAST
TECHNIQUE: Multidetector CT imaging of the chest was performed using the
standard protocol during bolus administration of intravenous
contrast. Multiplanar CT image reconstructions and MIPs were
obtained to evaluate the vascular anatomy. Multidetector CT imaging
of the abdomen and pelvis was performed using the standard protocol
during bolus administration of intravenous contrast.
CONTRAST:  100mL OMNIPAQUE IOHEXOL 350 MG/ML SOLN

[Series 7: pe thins · axial · 0.94mm/px · z∈[+986,+1262]mm · 2 of 277 slices shown]
[im 1/277  lung]
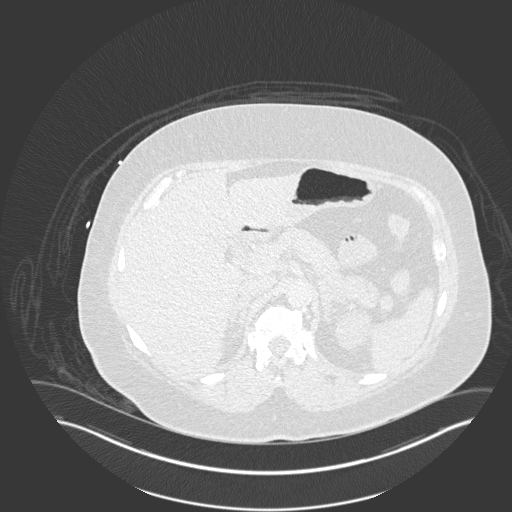
[im 277/277  soft-tissue]
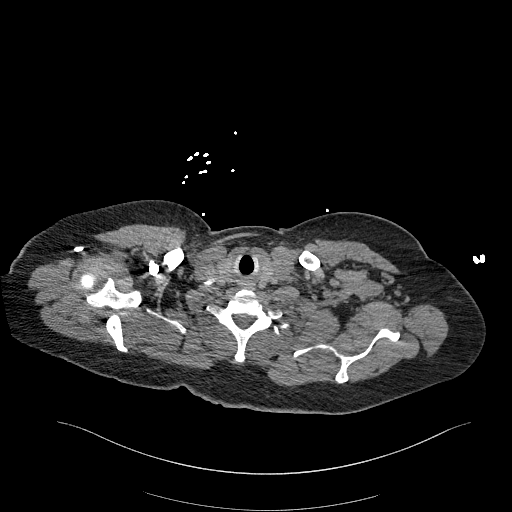

[2 of 16 positions shown; findings below may reference images not displayed]

FINDINGS: CTA CHEST FINDINGS

Cardiovascular: Preferential opacification of the bilateral
pulmonary arteries to the segmental level. No evidence of pulmonary
embolism.

No evidence of thoracic aortic aneurysm.

The heart is top-normal in size.  No pericardial effusion.

Mediastinum/Nodes: 13 mm short axis right paratracheal node (series
5/image 43). 11 mm short axis subcarinal node (series 5/image 56).
2.0 cm short axis right hilar node (series 5/image 50). Sixteen mm
short axis right infrahilar node (series 5/image 62).

Visualized thyroid is unremarkable.

Lungs/Pleura: Consolidation with air bronchograms in the anterior
right upper lobe (series 6/image 52), compatible with pneumonia.

Mild subpleural nodularity in the right upper and middle lobe
measuring 3-5 mm (series 6/images 71 and 76), likely benign.

Mild dependent atelectasis in the bilateral lower lobes.

Mild platelike scarring/atelectasis in the right middle lobe and
lingula.

No pleural effusion or pneumothorax.

Musculoskeletal: Mild degenerative changes of the lower thoracic
spine.

Review of the MIP images confirms the above findings.

CT ABDOMEN and PELVIS FINDINGS

Hepatobiliary: Liver is within normal limits.

Gallbladder is unremarkable. No intrahepatic or extrahepatic ductal
dilatation.

Pancreas: Within normal limits.

Spleen: Within normal limits.

Adrenals/Urinary Tract: Adrenal glands are within normal limits.

Mild right renal cortical scarring. 2.1 cm medial right upper pole
renal cyst (series 17/image 11). Left kidney is within normal
limits. No hydronephrosis.

Bladder is underdistended but unremarkable.

Stomach/Bowel: Stomach is within normal limits.

No evidence of bowel obstruction.

Prior appendectomy.

Vascular/Lymphatic: No evidence of abdominal aortic aneurysm.

No suspicious abdominopelvic lymphadenopathy.

Reproductive: Status post hysterectomy.

No adnexal masses.

Other: No abdominopelvic ascites.

Musculoskeletal: Degenerative changes at L5-S1.

Review of the MIP images confirms the above findings.
IMPRESSION: No evidence of pulmonary embolism.

Anterior right upper lobe pneumonia. While this may reflect
bronchopneumonia, given the patient's clinical history, correlate
for pending COVID test results.

Unremarkable CT abdomen/pelvis.

## 2020-01-13 IMAGING — DX PORTABLE CHEST - 1 VIEW
1 series · 1 of 1 positions shown · non-contrast
Comparison: 10/05/2018

CLINICAL DATA: Dyspnea. Pneumonia due to [id number] virus.

EXAM:
PORTABLE CHEST 1 VIEW

[chest]
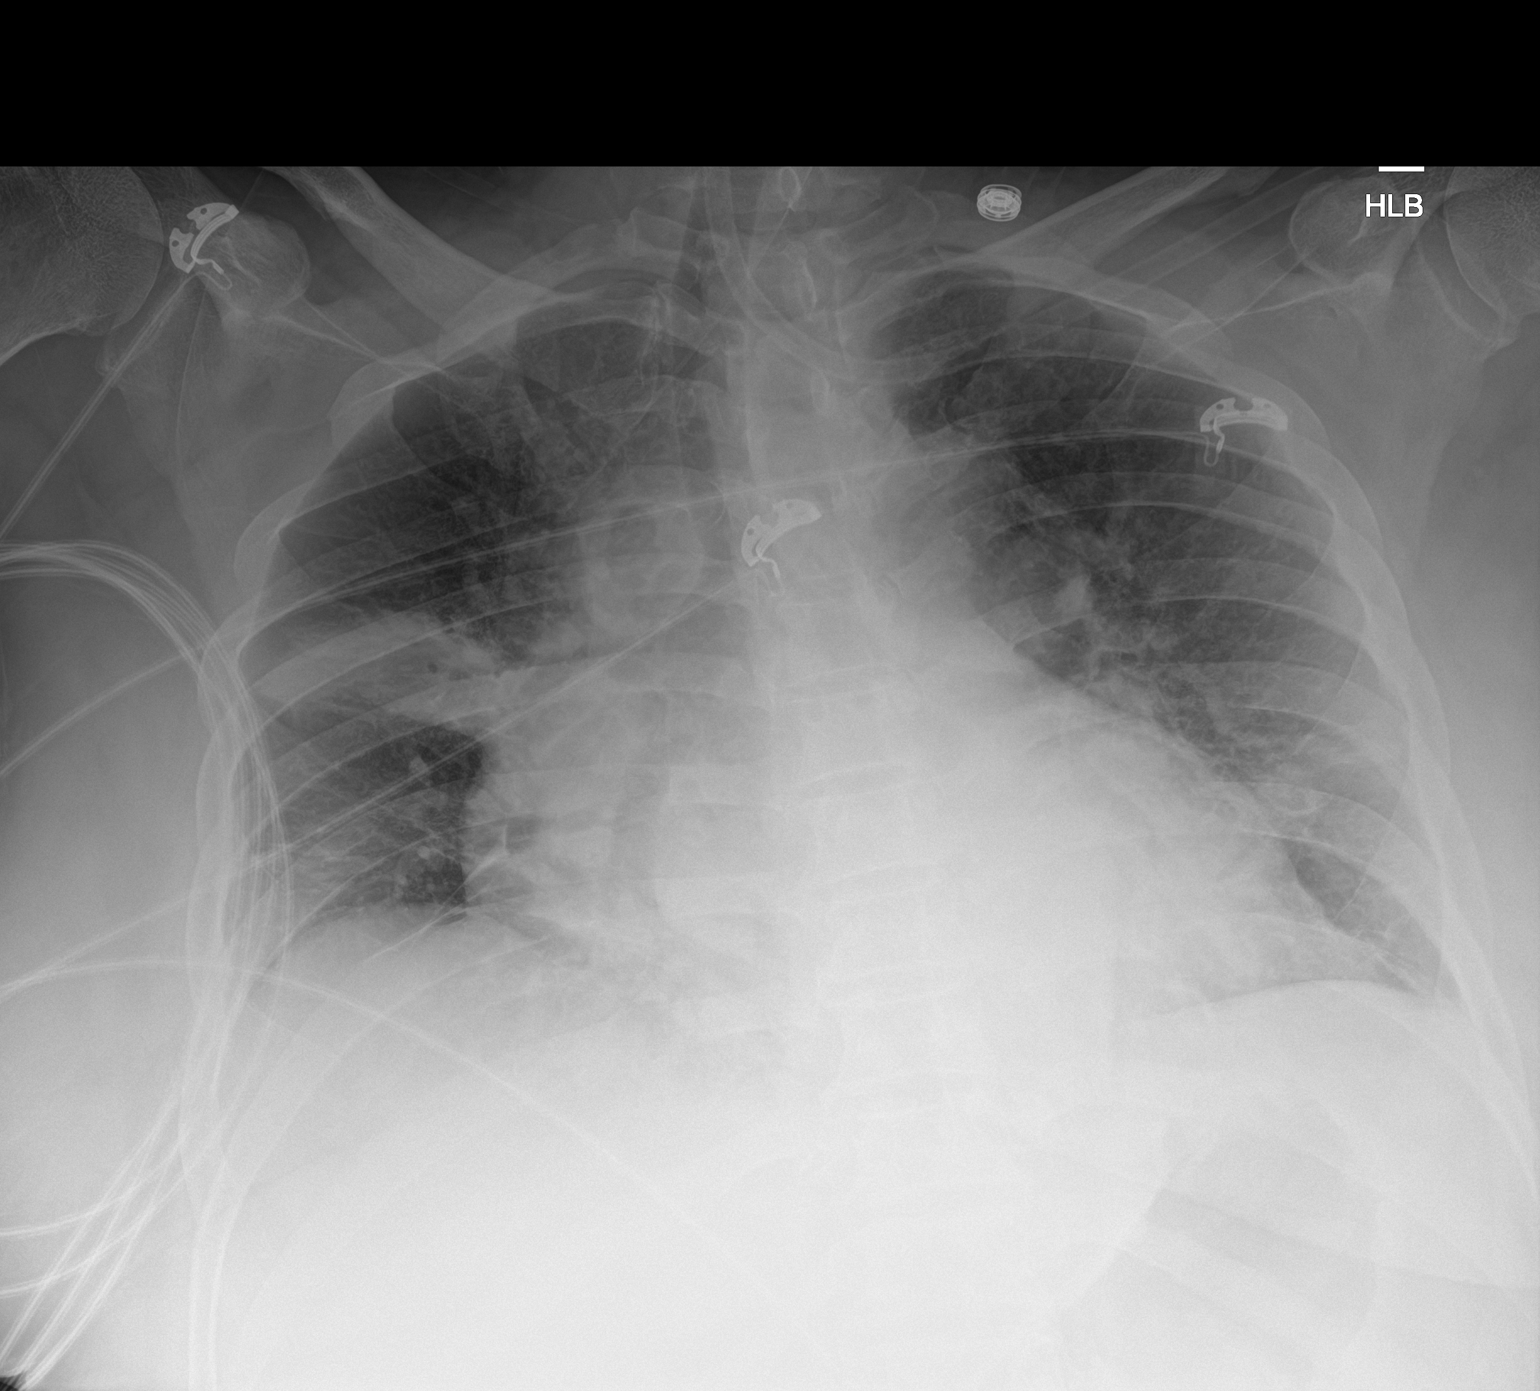

[1 of 1 positions shown; findings below may reference images not displayed]

FINDINGS: Increased heart size since prior study. Low lung volumes again
noted. Worsening consolidation is seen in the inferior aspect of the
right upper lobe. Increased airspace opacity is also seen in both
lower lobes with associated air bronchograms. No evidence of pleural
effusion.
IMPRESSION: Worsening right upper lobe consolidation and increased bilateral
lower lobe airspace disease.

Increased heart size.
